# Patient Record
Sex: Female | Born: 1985 | Race: White | Hispanic: No | Marital: Single | Smoking: Former smoker
Health system: Southern US, Community
[De-identification: ages and names within clinical notes are randomized; demographics above are authoritative.]

## PROBLEM LIST (undated history)

## (undated) DIAGNOSIS — E669 Obesity, unspecified: Secondary | ICD-10-CM

## (undated) DIAGNOSIS — N823 Fistula of vagina to large intestine: Secondary | ICD-10-CM

## (undated) DIAGNOSIS — D649 Anemia, unspecified: Secondary | ICD-10-CM

## (undated) DIAGNOSIS — K5010 Crohn's disease of large intestine without complications: Secondary | ICD-10-CM

## (undated) HISTORY — PX: OTHER SURGICAL HISTORY: SHX169

## (undated) HISTORY — PX: FISTULA PLUG: SHX5831

## (undated) HISTORY — DX: Obesity, unspecified: E66.9

## (undated) HISTORY — DX: Fistula of vagina to large intestine: N82.3

## (undated) HISTORY — DX: Anemia, unspecified: D64.9

## (undated) HISTORY — DX: Crohn's disease of large intestine without complications: K50.10

---
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
## 2008-09-23 ENCOUNTER — Emergency Department: Payer: Self-pay | Admitting: Internal Medicine

## 2010-02-03 ENCOUNTER — Ambulatory Visit: Payer: Self-pay | Admitting: Otolaryngology

## 2010-08-04 ENCOUNTER — Ambulatory Visit: Payer: Self-pay | Admitting: General Surgery

## 2010-08-07 ENCOUNTER — Ambulatory Visit: Payer: Self-pay | Admitting: General Surgery

## 2010-09-22 ENCOUNTER — Other Ambulatory Visit: Payer: Self-pay

## 2010-09-25 ENCOUNTER — Inpatient Hospital Stay: Payer: Self-pay | Admitting: Internal Medicine

## 2010-10-01 LAB — PATHOLOGY REPORT

## 2011-01-20 ENCOUNTER — Ambulatory Visit: Payer: Self-pay | Admitting: Unknown Physician Specialty

## 2011-04-24 ENCOUNTER — Ambulatory Visit: Payer: Self-pay | Admitting: Unknown Physician Specialty

## 2011-04-28 LAB — PATHOLOGY REPORT

## 2011-07-29 ENCOUNTER — Emergency Department: Payer: Self-pay | Admitting: *Deleted

## 2012-11-03 DIAGNOSIS — E669 Obesity, unspecified: Secondary | ICD-10-CM | POA: Insufficient documentation

## 2012-11-03 DIAGNOSIS — N823 Fistula of vagina to large intestine: Secondary | ICD-10-CM | POA: Insufficient documentation

## 2012-11-03 HISTORY — DX: Fistula of vagina to large intestine: N82.3

## 2012-11-03 HISTORY — DX: Obesity, unspecified: E66.9

## 2013-02-20 ENCOUNTER — Other Ambulatory Visit: Payer: Self-pay | Admitting: Unknown Physician Specialty

## 2013-02-22 LAB — STOOL CULTURE

## 2014-06-12 DIAGNOSIS — D649 Anemia, unspecified: Secondary | ICD-10-CM

## 2014-06-12 HISTORY — DX: Anemia, unspecified: D64.9

## 2014-10-22 ENCOUNTER — Ambulatory Visit: Payer: Self-pay | Admitting: Unknown Physician Specialty

## 2015-02-22 ENCOUNTER — Emergency Department: Payer: Self-pay | Admitting: Emergency Medicine

## 2015-04-08 LAB — SURGICAL PATHOLOGY

## 2015-08-15 ENCOUNTER — Ambulatory Visit: Payer: Self-pay | Admitting: Urology

## 2015-08-26 ENCOUNTER — Ambulatory Visit (INDEPENDENT_AMBULATORY_CARE_PROVIDER_SITE_OTHER): Payer: BC Managed Care – PPO | Admitting: Urology

## 2015-08-26 ENCOUNTER — Encounter: Payer: Self-pay | Admitting: Urology

## 2015-08-26 VITALS — BP 112/76 | HR 80 | Ht 67.0 in | Wt 293.2 lb

## 2015-08-26 DIAGNOSIS — R312 Other microscopic hematuria: Secondary | ICD-10-CM

## 2015-08-26 DIAGNOSIS — N390 Urinary tract infection, site not specified: Secondary | ICD-10-CM | POA: Diagnosis not present

## 2015-08-26 DIAGNOSIS — R3129 Other microscopic hematuria: Secondary | ICD-10-CM

## 2015-08-26 DIAGNOSIS — K5010 Crohn's disease of large intestine without complications: Secondary | ICD-10-CM

## 2015-08-26 HISTORY — DX: Crohn's disease of large intestine without complications: K50.10

## 2015-08-26 MED ORDER — CEPHALEXIN 500 MG PO CAPS: ORAL_CAPSULE | ORAL | Status: DC

## 2015-08-26 NOTE — Progress Notes (Signed)
Consultation for recurrent urinary tract infection.  1-recurrent UTI-patient has a recurrent episodes of tissue area, frequency, urgency and foul smelling urine. Cultures of been positive in the past but she believes her most recent culture was negative. Antibiotic to relieve her symptoms. She has no neurogenic risk. She usually voids with a good stream unless she is going frequently due to urgency from urinary tract infection. She's had no gross hematuria. She had microscopic hematuria on a prior exam but she was on her period. She's had no pneumaturia. 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 It would be difficult to surgically repair she's been told. She fell fistula drains and plucks. The fistula may be near the introitus. She mass times and she has bacteria in her urine and is asymptomatic but she is on Humira. There is a concern with her immunosuppression it could lead to a symptomatic UTI. She's had no fevers with these infections.  Her past medical, surgical history, family history, social history, medications and allergies were reviewed with significant findings noted above.  14 system review of systems obtained which was negative except for the following: Dizziness  Physical exam: Patient in no acute distress. She is alert and oriented 3.  Urinalysis shows 0-2 red cells, 6-10 white cells, moderate bacteria   Assessment/plan-recurrent UTI-although some of her episodes may be from asymptomatic bacteriuria any bacteria in her urine is a bit of a concern with her use of Humira. Will assess the kidneys and the bladder with an ultrasound to rule out any obvious nidus of infection or hydronephrosis. Also to check a post void residual. She'll return for pelvic exam and cystoscopy and we discussed the nature risk and benefits of these tests. In the meantime I'll send some cephalexin to her pharmacy so that she can have self start antibiotic. Also sent  urine for culture today.  Microscopic hematuria-this was likely contaminant from patients. But given her recurrent UTI well assess upper tracts with ultrasound. Hold off on CT for now.

## 2015-08-27 LAB — URINALYSIS, COMPLETE
Bilirubin, UA: NEGATIVE
GLUCOSE, UA: NEGATIVE
Ketones, UA: NEGATIVE
NITRITE UA: NEGATIVE
Protein, UA: NEGATIVE
Specific Gravity, UA: >1.03 — ABNORMAL HIGH (ref 1.005–1.030)
UUROB: 0.2 mg/dL (ref 0.2–1.0)
pH, UA: 5 (ref 5.0–7.5)

## 2015-08-27 LAB — URINE CULTURE

## 2015-08-27 LAB — MICROSCOPIC EXAMINATION

## 2015-08-28 ENCOUNTER — Other Ambulatory Visit: Payer: Self-pay | Admitting: Urology

## 2015-08-28 MED ORDER — CEPHALEXIN 500 MG PO CAPS: ORAL_CAPSULE | ORAL | Status: DC

## 2015-08-28 MED ORDER — CEPHALEXIN 500 MG PO CAPS: ORAL_CAPSULE | ORAL | Status: AC

## 2015-09-19 ENCOUNTER — Ambulatory Visit (INDEPENDENT_AMBULATORY_CARE_PROVIDER_SITE_OTHER): Payer: BC Managed Care – PPO | Admitting: Urology

## 2015-09-19 VITALS — BP 136/83 | HR 90 | Ht 67.0 in | Wt 294.8 lb

## 2015-09-19 DIAGNOSIS — R3129 Other microscopic hematuria: Secondary | ICD-10-CM

## 2015-09-19 LAB — URINALYSIS, COMPLETE
Bilirubin, UA: NEGATIVE
GLUCOSE, UA: NEGATIVE
NITRITE UA: NEGATIVE
Specific Gravity, UA: >1.03 — ABNORMAL HIGH (ref 1.005–1.030)
Urobilinogen, Ur: 0.2 mg/dL (ref 0.2–1.0)
pH, UA: 5.5 (ref 5.0–7.5)

## 2015-09-19 LAB — MICROSCOPIC EXAMINATION
Renal Epithel, UA: NONE SEEN /hpf
WBC, UA: >30 /hpf — AB (ref 0–?)

## 2015-09-19 MED ORDER — LIDOCAINE HCL 2 % EX GEL: 1.0000 "application " | Freq: Once | CUTANEOUS | Status: DC

## 2015-09-19 MED ORDER — CIPROFLOXACIN HCL 500 MG PO TABS: 500.0000 mg | ORAL_TABLET | Freq: Once | ORAL | Status: DC

## 2015-09-19 NOTE — Progress Notes (Signed)
Patient has suspicious U/A and dysuria.  Will culture her urine and treat if positive.  Will reschedule Cysto. Patient also still needs renal/bladder u/s.

## 2015-09-21 LAB — CULTURE, URINE COMPREHENSIVE

## 2015-09-23 ENCOUNTER — Telehealth: Payer: Self-pay | Admitting: Urology

## 2015-09-23 NOTE — Telephone Encounter (Signed)
The pt called requesting her urine culture results from her visit on Thursday, Oct 09/19/15. Pt notified per Mendel Ryder that her urine culture was negative for bacteria.

## 2015-10-04 ENCOUNTER — Ambulatory Visit
Admission: RE | Admit: 2015-10-04 | Discharge: 2015-10-04 | Disposition: A | Discharge status: Home / Self Care | Payer: BC Managed Care – PPO | Source: Ambulatory Visit | Attending: Urology | Admitting: Urology

## 2015-10-04 DIAGNOSIS — R3129 Other microscopic hematuria: Secondary | ICD-10-CM | POA: Insufficient documentation

## 2015-10-15 ENCOUNTER — Encounter: Payer: Self-pay | Admitting: Urology

## 2015-10-15 ENCOUNTER — Ambulatory Visit (INDEPENDENT_AMBULATORY_CARE_PROVIDER_SITE_OTHER): Payer: BC Managed Care – PPO | Admitting: Urology

## 2015-10-15 VITALS — BP 121/77 | HR 71 | Ht 66.0 in | Wt 296.5 lb

## 2015-10-15 DIAGNOSIS — R3129 Other microscopic hematuria: Secondary | ICD-10-CM

## 2015-10-15 LAB — URINALYSIS, COMPLETE
BILIRUBIN UA: NEGATIVE
Glucose, UA: NEGATIVE
Ketones, UA: NEGATIVE
NITRITE UA: NEGATIVE
Protein, UA: NEGATIVE
Urobilinogen, Ur: 0.2 mg/dL (ref 0.2–1.0)
pH, UA: 5 (ref 5.0–7.5)

## 2015-10-15 LAB — MICROSCOPIC EXAMINATION
RBC, UA: >30 /hpf — AB (ref 0–?)
RENAL EPITHEL UA: NONE SEEN /HPF

## 2015-10-15 MED ORDER — CIPROFLOXACIN HCL 500 MG PO TABS: 500.0000 mg | ORAL_TABLET | Freq: Once | ORAL | Status: AC

## 2015-10-15 MED ORDER — LIDOCAINE HCL 2 % EX GEL: 1.0000 "application " | Freq: Once | CUTANEOUS | Status: AC

## 2015-10-15 NOTE — Progress Notes (Signed)
F/u recurrent UTI with symptoms of dysuria, frequency, urgency and foul smelling urine. Cultures of been positive in the past. Abx relieve symptoms. No neurogenic risk. She usually voids with a good stream. 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 The fistula may be near the introitus. She is on Humira.   She returns today in continued management of bacteriuria. A renal ultrasound was normal. The bladder was not distended. I reviewed all the images. Compared images to CT in 2011 and MRI of the pelvis in 2012.  Repeat urine culture showed a low colony count of mixed growth.  Chaperone: kelita PE: abd soft, NT GU - bladder and urethra are palpably normal. Urethral meatus appeared normal. Introitus appeared normal.   Cystoscopy - the 15 French flexible cystoscope was inserted per urethra after she was prepped in the usual sterile fashion and after she was in and out catheterized the sterile 14 French red rubber catheter. Findings: The urethra appeared normal. The trigone and ureteral orifices were in their normal orthotopic position with clear efflux bilaterally. The bladder mucosa was smooth and without lesion. There were no stones or foreign bodies in the bladder. There were no areas concerning for fistula.   A/P: Bacteriuria - I sent a cathed UA to lab today to ensure she truly has bacteria in urine and that her UA's aren't simply contaminants. Her renal u/s, cystoscopy and exam were normal. She may resume Humira from a GU pt of view.   F/u 3 - 6 months for symptom check.

## 2015-10-16 LAB — MICROSCOPIC EXAMINATION
BACTERIA UA: NONE SEEN
RENAL EPITHEL UA: NONE SEEN /HPF
WBC, UA: NONE SEEN /hpf (ref 0–?)

## 2015-10-16 LAB — URINALYSIS, COMPLETE
Bilirubin, UA: NEGATIVE
GLUCOSE, UA: NEGATIVE
Ketones, UA: NEGATIVE
Leukocytes, UA: NEGATIVE
NITRITE UA: NEGATIVE
PH UA: 5.5 (ref 5.0–7.5)
Protein, UA: NEGATIVE
Specific Gravity, UA: <1.005 — ABNORMAL LOW (ref 1.005–1.030)
Urobilinogen, Ur: 0.2 mg/dL (ref 0.2–1.0)

## 2015-10-16 NOTE — Progress Notes (Signed)
Patient's cath UA was clear. No bacteria, wbc or rbc's.

## 2015-10-17 LAB — CULTURE, URINE COMPREHENSIVE

## 2015-12-19 ENCOUNTER — Ambulatory Visit: Payer: BC Managed Care – PPO | Admitting: Urology

## 2015-12-19 ENCOUNTER — Encounter: Payer: Self-pay | Admitting: Urology

## 2017-02-01 ENCOUNTER — Ambulatory Visit: Payer: BC Managed Care – PPO | Admitting: Urology

## 2017-02-01 VITALS — BP 128/82 | HR 94 | Ht 67.0 in | Wt 297.4 lb

## 2017-02-01 DIAGNOSIS — R102 Pelvic and perineal pain: Secondary | ICD-10-CM | POA: Diagnosis not present

## 2017-02-01 DIAGNOSIS — R3129 Other microscopic hematuria: Secondary | ICD-10-CM | POA: Diagnosis not present

## 2017-02-01 LAB — URINALYSIS, COMPLETE
BILIRUBIN UA: NEGATIVE
GLUCOSE, UA: NEGATIVE
KETONES UA: NEGATIVE
NITRITE UA: NEGATIVE
SPEC GRAV UA: 1.02 (ref 1.005–1.030)
UUROB: 0.2 mg/dL (ref 0.2–1.0)
pH, UA: 5.5 (ref 5.0–7.5)

## 2017-02-01 LAB — MICROSCOPIC EXAMINATION

## 2017-02-01 MED ORDER — DIAZEPAM 10 MG PO TABS: 10.0000 mg | ORAL_TABLET | Freq: Every evening | ORAL | 0 refills | Status: DC | PRN

## 2017-02-01 NOTE — Progress Notes (Signed)
02/01/2017 11:21 AM   Diane Burgess 1986/11/18 [id number]  Referring provider: Sherrin Daisy, MD [place], [place] [address]  Chief Complaint  Patient presents with  . Follow-up    recurrent UTI    HPI: 31 year old obese female with a history of Crohn's disease and a rectovaginal fistula who presents today for dysuria. Her symptoms of present present for the past 2 and a half weeks. She describes a constant bladder pain, worse with a full bladder area and she does have pain with urination. She denies any hematuria. She denies any incontinence. She denies any worsening frequency or urgency. She does state that she at times strains to void, has intermittency, and the feeling of incomplete bladder emptying. She does take Humira, but stops because of the pain recently. She was fully evaluated in the fall of 2016 including cystoscopy which was normal. The patient is not sexually active, and does not endorse vaginal tenderness. She denies constipation, conversely she has loose bowels typically because of her Crohn's.  The patient's urinalysis today is contaminated with greater than 10 epithelial cells. There is also microscopic hematuria and white blood cells.     PMH: Past Medical History:  Diagnosis Date  . 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 Adiposity 11/03/2012  . Crohn's disease of rectum ([place]) 08/26/2015  . 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 fistula drain    . FISTULA PLUG      Home Medications:  Allergies as of 02/01/2017   No Known Allergies     Medication List       Accurate as of 02/01/17 11:21 AM. Always use your most recent med list.          Adalimumab 40 MG/0.8ML Pnkt Inject into the skin.   cephALEXin 500 MG capsule Commonly known as:  KEFLEX Take by mouth.       Allergies: No Known Allergies  Family History: Family History  Problem Relation Age of Onset  .  Prostate cancer Neg Hx   . Kidney cancer Neg Hx   . Bladder Cancer Neg Hx     Social History:  reports that she quit smoking about 7 years ago. She does not have any smokeless tobacco history on file. She reports that she does not drink alcohol or use drugs.  ROS: UROLOGY Frequent Urination?: Yes Hard to postpone urination?: No Burning/pain with urination?: Yes Get up at night to urinate?: No Leakage of urine?: No Urine stream starts and stops?: No Trouble starting stream?: Yes Do you have to strain to urinate?: No Blood in urine?: No Urinary tract infection?: Yes Sexually transmitted disease?: No Injury to kidneys or bladder?: No Painful intercourse?: No Weak stream?: No Currently pregnant?: No Vaginal bleeding?: No Last menstrual period?: n  Gastrointestinal Nausea?: Yes Vomiting?: No Indigestion/heartburn?: No Diarrhea?: No Constipation?: No  Constitutional Fever: No Night sweats?: No Weight loss?: No Fatigue?: No  Skin Skin rash/lesions?: No Itching?: No  Eyes Blurred vision?: No Double vision?: No  Ears/Nose/Throat Sore throat?: No Sinus problems?: No  Hematologic/Lymphatic Swollen glands?: No Easy bruising?: No  Cardiovascular Leg swelling?: No Chest pain?: No  Respiratory Cough?: No Shortness of breath?: No  Endocrine Excessive thirst?: No  Musculoskeletal Back pain?: No Joint pain?: No  Neurological Headaches?: No Dizziness?: No  Psychologic Depression?: No Anxiety?: No  Physical Exam: BP 128/82   Pulse 94   Ht 5' 7"  (1.702 m)   Wt  134.9 kg (297 lb 6.4 oz)   BMI 46.58 kg/m   Constitutional:  Alert and oriented, No acute distress. HEENT: [place] AT, moist mucus membranes.  Trachea midline, no masses. Cardiovascular: No clubbing, cyanosis, or edema. Respiratory: Normal respiratory effort, no increased work of breathing. GI: Abdomen is soft, nontender, nondistended, no abdominal masses GU: No CVA tenderness. Skin: No rashes,  bruises or suspicious lesions. Lymph: No cervical or inguinal adenopathy. Neurologic: Grossly intact, no focal deficits, moving all 4 extremities. Psychiatric: Normal mood and affect.  Laboratory Data: No results found for: WBC, HGB, HCT, MCV, PLT  No results found for: CREATININE  No results found for: PSA  No results found for: TESTOSTERONE  No results found for: HGBA1C  Urinalysis    Component Value Date/Time   APPEARANCEUR Clear 10/15/2015 1645   GLUCOSEU Negative 10/15/2015 1645   BILIRUBINUR Negative 10/15/2015 1645   PROTEINUR Negative 10/15/2015 1645   NITRITE Negative 10/15/2015 1645   LEUKOCYTESUR Negative 10/15/2015 1645    Pertinent Imaging: No images reviewed  Assessment & Plan:  The patient has what sounds to me like pelvic floor dysfunction. Her urine today is contaminated and we will repeat it with a cath specimen. We will send this for culture to ensure she does not have an infection. Beyond that, I recommended that we try intravaginal volume in [place] for his symptoms. If her symptoms have not improved, she will return in 1 month and we will likely refer her to physical therapy and do a more complete evaluation including cystoscopy and a more thorough/detailed pelvic evaluation. The patient will also inquire with her Crohn's doctor whether she can take meloxicam, which would otherwise be helpful for her pelvic inflammation and pain. 1. Microhematuria Straight cath, f/u urine culture. - Urinalysis, Complete   No Follow-up on file.  Ardis Hughs, [facility] [address], [place], [place] [address] [phone]

## 2017-02-04 LAB — CULTURE, URINE COMPREHENSIVE

## 2017-02-10 ENCOUNTER — Telehealth: Payer: Self-pay

## 2017-02-10 DIAGNOSIS — R102 Pelvic and perineal pain: Secondary | ICD-10-CM

## 2017-02-10 MED ORDER — MELOXICAM 15 MG PO TABS: 15.0000 mg | ORAL_TABLET | Freq: Every day | ORAL | 3 refills | Status: DC

## 2017-02-10 NOTE — Telephone Encounter (Signed)
Pt called stating she spoke with her Crohn's physician who stated it was ok for her to take meloxicam. Therefore per Dr. Louis Meckel dictation medication was sent to pt pharmacy.

## 2017-06-13 DIAGNOSIS — E538 Deficiency of other specified B group vitamins: Secondary | ICD-10-CM | POA: Insufficient documentation

## 2017-07-06 ENCOUNTER — Encounter: Payer: Self-pay | Admitting: *Deleted

## 2017-07-07 ENCOUNTER — Ambulatory Visit: Payer: BC Managed Care – PPO | Admitting: Anesthesiology

## 2017-07-07 ENCOUNTER — Ambulatory Visit
Admission: RE | Admit: 2017-07-07 | Discharge: 2017-07-07 | Disposition: A | Discharge status: Home / Self Care | Payer: BC Managed Care – PPO | Source: Ambulatory Visit | Attending: Unknown Physician Specialty | Admitting: Unknown Physician Specialty

## 2017-07-07 ENCOUNTER — Encounter: Payer: Self-pay | Admitting: *Deleted

## 2017-07-07 ENCOUNTER — Encounter
Admission: RE | Disposition: A | Discharge status: Home / Self Care | Payer: Self-pay | Source: Ambulatory Visit | Attending: Unknown Physician Specialty

## 2017-07-07 DIAGNOSIS — Z87891 Personal history of nicotine dependence: Secondary | ICD-10-CM | POA: Insufficient documentation

## 2017-07-07 DIAGNOSIS — D120 Benign neoplasm of cecum: Secondary | ICD-10-CM | POA: Insufficient documentation

## 2017-07-07 DIAGNOSIS — Z79899 Other long term (current) drug therapy: Secondary | ICD-10-CM | POA: Insufficient documentation

## 2017-07-07 DIAGNOSIS — K5090 Crohn's disease, unspecified, without complications: Secondary | ICD-10-CM | POA: Diagnosis present

## 2017-07-07 DIAGNOSIS — K6389 Other specified diseases of intestine: Secondary | ICD-10-CM | POA: Insufficient documentation

## 2017-07-07 DIAGNOSIS — K640 First degree hemorrhoids: Secondary | ICD-10-CM | POA: Insufficient documentation

## 2017-07-07 DIAGNOSIS — D649 Anemia, unspecified: Secondary | ICD-10-CM | POA: Insufficient documentation

## 2017-07-07 HISTORY — PX: COLONOSCOPY WITH PROPOFOL: SHX5780

## 2017-07-07 LAB — POCT PREGNANCY, URINE: PREG TEST UR: NEGATIVE

## 2017-07-07 SURGERY — COLONOSCOPY WITH PROPOFOL
Anesthesia: General

## 2017-07-07 MED ORDER — SODIUM CHLORIDE 0.9 % IV SOLN
INTRAVENOUS | Status: DC
  Administered 2017-07-07: 1000 mL via INTRAVENOUS

## 2017-07-07 MED ORDER — MIDAZOLAM HCL 2 MG/2ML IJ SOLN
INTRAMUSCULAR | Status: DC | PRN
Start: 2017-07-07 — End: 2017-07-07
  Administered 2017-07-07: 2 mg via INTRAVENOUS

## 2017-07-07 MED ORDER — SODIUM CHLORIDE 0.9 % IV SOLN
INTRAVENOUS | Status: DC
  Administered 2017-07-07: 15:00:00 via INTRAVENOUS

## 2017-07-07 MED ORDER — MIDAZOLAM HCL 2 MG/2ML IJ SOLN
INTRAMUSCULAR | Status: AC
  Filled 2017-07-07: qty 2

## 2017-07-07 MED ORDER — PROPOFOL 10 MG/ML IV BOLUS
INTRAVENOUS | Status: DC | PRN
  Administered 2017-07-07: 40 mg via INTRAVENOUS

## 2017-07-07 MED ORDER — PROPOFOL 500 MG/50ML IV EMUL
INTRAVENOUS | Status: DC | PRN
  Administered 2017-07-07: 140 ug/kg/min via INTRAVENOUS

## 2017-07-07 MED ORDER — PROPOFOL 500 MG/50ML IV EMUL
INTRAVENOUS | Status: AC
  Filled 2017-07-07: qty 50

## 2017-07-07 NOTE — Anesthesia Post-op Follow-up Note (Cosign Needed)
Anesthesia QCDR form completed.        

## 2017-07-07 NOTE — Anesthesia Postprocedure Evaluation (Signed)
Anesthesia Post Note  Patient: Diane Burgess  Procedure(s) Performed: Procedure(s) (LRB): COLONOSCOPY WITH PROPOFOL (N/A)  Patient location during evaluation: Endoscopy Anesthesia Type: General Level of consciousness: awake and alert and oriented Pain management: pain level controlled Vital Signs Assessment: post-procedure vital signs reviewed and stable Respiratory status: spontaneous breathing, nonlabored ventilation and respiratory function stable Cardiovascular status: blood pressure returned to baseline and stable Postop Assessment: no signs of nausea or vomiting Anesthetic complications: no     Last Vitals:  Vitals:   07/07/17 1608 07/07/17 1618  BP: 120/72 133/82  Pulse: 88 75  Resp: 16 16  Temp:      Last Pain:  Vitals:   07/07/17 1548  TempSrc: Tympanic                 [name]

## 2017-07-07 NOTE — Op Note (Signed)
[facility] Patient Name: Diane Burgess Procedure Date: 07/07/2017 3:15 PM MRN: [id number] Account #: [id number] Date of Birth: [birth year] Admit Type: Outpatient Age: 31 Room: [facility] ENDO ROOM 3 Gender: Female Note Status: Finalized Procedure:            Colonoscopy Indications:          High risk colon cancer surveillance: Crohn's disease                        large intestine Providers:            Manya Silvas, MD Referring MD:         Toniann Ket, MD (Referring MD) Medicines:            Propofol per Anesthesia Complications:        No immediate complications. Procedure:            Pre-Anesthesia Assessment:                       - After reviewing the risks and benefits, the patient                        was deemed in satisfactory condition to undergo the                        procedure.                       After obtaining informed consent, the colonoscope was                        passed under direct vision. Throughout the procedure,                        the patient's blood pressure, pulse, and oxygen                        saturations were monitored continuously. The                        Colonoscope was introduced through the anus and                        advanced to the the cecum, identified by appendiceal                        orifice and ileocecal valve. The colonoscopy was                        performed without difficulty. The patient tolerated the                        procedure well. The quality of the bowel preparation                        was good. Findings:      Two sessile polyps were found in the ileocecal valve. The polyps were       small in size. These polyps were removed with a hot snare. Resection and       retrieval were complete.      A diffuse area of mildly  erythematous mucosa was found in the sigmoid       colon and in the descending colon. Somewhat patchy, somewhat slightly       swollen.   rough Internal hemorrhoids were found during endoscopy. The hemorrhoids       were small and Grade I (internal hemorrhoids that do not prolapse).      Numerous pseudopolyps were seen Throughout the colon. In most of the       colon there was no obvious colon inflammation. Impression:           - Two small polyps at the ileocecal valve, removed with                        a hot snare. Resected and retrieved.                       - Erythematous mucosa in the sigmoid colon and in the                        descending colon.                       - Internal hemorrhoids. Recommendation:       - Await pathology results. Manya Silvas, MD 07/07/2017 3:49:57 PM This report has been signed electronically. Number of Addenda: 0 Note Initiated On: 07/07/2017 3:15 PM Scope Withdrawal Time: 0 hours 15 minutes 45 seconds  Total Procedure Duration: 0 hours 21 minutes 37 seconds       [facility]

## 2017-07-07 NOTE — H&P (Signed)
   Primary Care Physician:  Katheren Shams Primary Gastroenterologist:  Dr. Vira Agar  Pre-Procedure History & Physical: HPI:  Diane Burgess is a 31 y.o. female is here for an colonoscopy.   Past Medical History:  Diagnosis Date  . 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 Adiposity 11/03/2012  . Crohn's disease of rectum ([place]) 08/26/2015  . 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 fistula drain    . FISTULA PLUG      Prior to Admission medications   Medication Sig Start Date End Date Taking? Authorizing Provider  Adalimumab 40 MG/0.8ML PNKT Inject into the skin. 10/22/10   [provider]  diazepam (VALIUM) 10 MG tablet Take 1 tablet (10 mg total) by mouth at bedtime as needed (vaginal/pelvic pain). Insert deep into vagina prior to bedtime Patient not taking: Reported on 07/07/2017 02/01/17   Ardis Hughs, MD  meloxicam (MOBIC) 15 MG tablet Take 1 tablet (15 mg total) by mouth daily. Patient not taking: Reported on 07/07/2017 02/10/17   Ardis Hughs, MD    Allergies as of 05/13/2017  . (No Known Allergies)    Family History  Problem Relation Age of Onset  . Prostate cancer Neg Hx   . Kidney cancer Neg Hx   . Bladder Cancer Neg Hx     Social History   Social History  . Marital status: Single    Spouse name: N/A  . Number of children: N/A  . Years of education: N/A   Occupational History  . Not on file.   Social History Main Topics  . Smoking status: Former Smoker    Quit date: 08/25/2009  . Smokeless tobacco: Never Used  . Alcohol use No  . Drug use: No  . Sexual activity: Not on file   Other Topics Concern  . Not on file   Social History Narrative  . No narrative on file    Review of Systems: See HPI, otherwise negative ROS  Physical Exam: BP (!) 154/104   Pulse 98   Temp 98 F (36.7 C) (Tympanic)   Resp 18   Ht 5' 7"  (1.702 m)   Wt 136.1 kg (300 lb)   SpO2 100%   BMI 46.99  kg/m  General:   Alert,  pleasant and cooperative in NAD Head:  Normocephalic and atraumatic. Neck:  Supple; no masses or thyromegaly. Lungs:  Clear throughout to auscultation.    Heart:  Regular rate and rhythm. Abdomen:  Soft, nontender and nondistended. Normal bowel sounds, without guarding, and without rebound.   Neurologic:  Alert and  oriented x4;  grossly normal neurologically.  Impression/Plan: Diane Burgess is here for an colonoscopy to be performed for Crohn's disease.  Risks, benefits, limitations, and alternatives regarding  colonoscopy have been reviewed with the patient.  Questions have been answered.  All parties agreeable.   Gaylyn Cheers, MD  07/07/2017, 3:12 PM

## 2017-07-07 NOTE — Anesthesia Preprocedure Evaluation (Signed)
Anesthesia Evaluation  Patient identified by MRN, date of birth, ID band Patient awake    Reviewed: Allergy & Precautions, H&P , NPO status , Patient's Chart, lab work & pertinent test results  History of Anesthesia Complications Negative for: history of anesthetic complications  Airway Mallampati: III  TM Distance: >3 FB Neck ROM: full    Dental  (+) Teeth Intact   Pulmonary neg shortness of breath, former smoker,           Cardiovascular Exercise Tolerance: Good (-) angina(-) Past MI and (-) DOE negative cardio ROS       Neuro/Psych negative neurological ROS  negative psych ROS   GI/Hepatic negative GI ROS, Neg liver ROS, neg GERD  ,  Endo/Other  negative endocrine ROS  Renal/GU negative Renal ROS  negative genitourinary   Musculoskeletal   Abdominal   Peds  Hematology negative hematology ROS (+)   Anesthesia Other Findings Past Medical History: 06/12/2014: Absolute anemia 11/03/2012: Adiposity 08/26/2015: Crohn's disease of rectum ([place]) 11/03/2012: Rectal vaginal fistula  Past Surgical History: No date: fistula drain No date: FISTULA PLUG  BMI    Body Mass Index:  46.99 kg/m      Reproductive/Obstetrics negative OB ROS                             Anesthesia Physical Anesthesia Plan  ASA: III  Anesthesia Plan: General   Post-op Pain Management:    Induction: Intravenous  PONV Risk Score and Plan:   Airway Management Planned: Natural Airway and Nasal Cannula  Additional Equipment:   Intra-op Plan:   Post-operative Plan:   Informed Consent: I have reviewed the patients History and Physical, chart, labs and discussed the procedure including the risks, benefits and alternatives for the proposed anesthesia with the patient or authorized representative who has indicated his/her understanding and acceptance.   Dental Advisory Given  Plan Discussed with:  Anesthesiologist, CRNA and Surgeon  Anesthesia Plan Comments: (Patient consented for risks of anesthesia including but not limited to:  - adverse reactions to medications - risk of intubation if required - damage to teeth, lips or other oral mucosa - sore throat or hoarseness - Damage to heart, brain, lungs or loss of life  Patient voiced understanding.)        Anesthesia Quick Evaluation

## 2017-07-07 NOTE — Anesthesia Post-op Follow-up Note (Deleted)
Anesthesia QCDR form completed.        

## 2017-07-07 NOTE — Transfer of Care (Signed)
Immediate Anesthesia Transfer of Care Note  Patient: Diane Burgess  Procedure(s) Performed: Procedure(s): COLONOSCOPY WITH PROPOFOL (N/A)  Patient Location: Endoscopy Unit  Anesthesia Type:General  Level of Consciousness: awake, oriented and patient cooperative  Airway & Oxygen Therapy: Patient Spontanous Breathing and Patient connected to nasal cannula oxygen  Post-op Assessment: Report given to RN, Post -op Vital signs reviewed and stable and Patient moving all extremities X 4  Post vital signs: Reviewed and stable  Last Vitals:  Vitals:   07/07/17 1422  BP: (!) 154/104  Pulse: 98  Resp: 18  Temp: 36.7 C    Last Pain:  Vitals:   07/07/17 1422  TempSrc: Tympanic         Complications: No apparent anesthesia complications

## 2017-07-08 ENCOUNTER — Encounter: Payer: Self-pay | Admitting: Unknown Physician Specialty

## 2017-07-09 LAB — SURGICAL PATHOLOGY

## 2018-10-20 DIAGNOSIS — K602 Anal fissure, unspecified: Secondary | ICD-10-CM | POA: Insufficient documentation

## 2018-10-21 ENCOUNTER — Ambulatory Visit: Payer: BC Managed Care – PPO | Admitting: Obstetrics and Gynecology

## 2018-10-21 ENCOUNTER — Encounter: Payer: Self-pay | Admitting: Obstetrics and Gynecology

## 2018-10-21 VITALS — BP 126/89 | HR 94 | Ht 67.0 in | Wt 300.0 lb

## 2018-10-21 DIAGNOSIS — K50113 Crohn's disease of large intestine with fistula: Secondary | ICD-10-CM | POA: Diagnosis not present

## 2018-10-21 DIAGNOSIS — K602 Anal fissure, unspecified: Secondary | ICD-10-CM

## 2018-10-21 DIAGNOSIS — K603 Anal fistula: Secondary | ICD-10-CM | POA: Diagnosis not present

## 2018-10-21 NOTE — Progress Notes (Signed)
HPI:      Ms. Diane Burgess is a 32 y.o. No obstetric history on file. who LMP was Patient's last menstrual period was 10/10/2018 (exact date).  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  Patient has a history of Crohn's disease and recurrent rectovaginal fistulae.  She has had previous surgery including mesh placement but this "failed".  She has been dealing with a fistula that she knows about for 2 to 3 years but it recently has become more tender and actually felt like it was "tearing".  She also complains of some anal verge discomfort and wonders if there is any evidence of fistula in this area.  She reports that she does not have any stool coming through the fistula into the vagina.  She has an appointment at [facility] in the near future for evaluation and management of her Crohn's disease and fistulae sequelae.   She is currently not sexually active and is not using any birth control.  She has normal regular menstrual cycles.    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 She does not have any pertinent problems on file. She  has a past surgical history that includes Fistula plug; fistula drain; and Colonoscopy with propofol (N/A, 07/07/2017). Her family history is not on file. She  reports that she quit smoking about 9 years ago. She has never used smokeless tobacco. She reports that she does not drink alcohol or use drugs. She has a current medication list which includes the following Facility-Administered Medications: ciprofloxacin and lidocaine. She has No Known Allergies.       Review of Systems:  Review of Systems  Constitutional: Denied constitutional symptoms, night sweats, recent illness, fatigue, fever, insomnia and  weight loss.  Eyes: Denied eye symptoms, eye pain, photophobia, vision change and visual disturbance.  Ears/Nose/Throat/Neck: Denied ear, nose, throat or neck symptoms, hearing loss, nasal discharge, sinus congestion and sore throat.  Cardiovascular: Denied cardiovascular symptoms, arrhythmia, chest pain/pressure, edema, exercise intolerance, orthopnea and palpitations.  Respiratory: Denied pulmonary symptoms, asthma, pleuritic pain, productive sputum, cough, dyspnea and wheezing.  Gastrointestinal: Denied, gastro-esophageal reflux, melena, nausea and vomiting.  Genitourinary: See HPI for additional information.  Musculoskeletal: Denied musculoskeletal symptoms, stiffness, swelling, muscle weakness and myalgia.  Dermatologic: Denied dermatology symptoms, rash and scar.  Neurologic: Denied neurology symptoms, dizziness, headache, neck pain and syncope.  Psychiatric: Denied psychiatric symptoms, anxiety and depression.  Endocrine: Denied endocrine symptoms including hot flashes and night sweats.   Meds:   No current outpatient medications on file prior to visit.   Current Facility-Administered Medications on File Prior to Visit  Medication Dose Route Frequency Provider Last Rate Last Dose  . ciprofloxacin (CIPRO) tablet 500 mg  500 mg Oral Once Festus Aloe, MD      . lidocaine (XYLOCAINE) 2 % jelly 1 application  1 application Urethral Once Festus Aloe, MD        Objective:     Vitals:   10/21/18 0836  BP: 126/89  Pulse: 94              Physical examination   Pelvic:   Vulva:  There is an obvious fistula present in the perineal body just below the introitus.  It is draining a yellowish discharge.  Vagina: No lesions or abnormalities noted.  The same discharge as noted above can be elicited just inside the hymeneal ring although no obvious fistula or fistulous tract is noted in this area.  Support: Normal pelvic support.  Urethra No masses tenderness or scarring.   Meatus Normal size without lesions or prolapse.     Anus:  There is an anal fissure present but I could find no tract to indicate it is related to her fistulae formation  Perineum: Normal exam.  No lesions.     Assessment:    No obstetric history on file. Patient Active Problem List   Diagnosis Date Noted  . Crohn's disease of rectum ([place]) 08/26/2015  . 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 Adiposity 11/03/2012  . 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 Anal fistula   2. Anal fissure   3. Crohn's disease of large intestine with fistula ([place])     Crohn's disease resulting fistulae.  Patient is obviously aware of these issues and has an appointment at [facility] with a Crohn's disease specialist who may then refer her for further evaluation and possible treatment.   Plan:            1.  Patient to keep her [facility] appointment.  We have discussed anal fissures in detail with special attention to diagnosis and management. Orders No orders of the defined types were placed in this encounter.   No orders of the defined types were placed in this encounter.     F/U  No follow-ups on file. I spent 32 minutes involved in the care of this patient of which greater than 50% was spent discussing Crohn's disease, rectovaginal fistulae, work-up management treatment etc.  All her questions were answered to the best of my ability.  Finis Bud, M.D. 10/21/2018 9:52 AM

## 2018-10-21 NOTE — Progress Notes (Signed)
NP presents today to discuss a fistula she has had for 3-4 years. Pt states she has tried treating the fistula before but has not has success.

## 2018-11-21 NOTE — Progress Notes (Signed)
11/22/2018  3:28 PM   Diane Burgess [birth year] [id number]  Referring provider: Katheren Shams [address] [place], [place] [address]  Chief Complaint  Patient presents with  . Hematuria    HPI: Diane Burgess is a 32 y.o. Female that presents today for her UTI symptoms. She has a history of Crohn's disease.  She admits she hasn't seen a urologist since 01/2017. She reports her perineal fistula is active and they are working out a treatment plan for that.  She complains of frequency, urgency with minimal void, cramping dysuria, urine leakage, and hematuria that began last week. She reports symptoms started with frequency and developed into dysuria by 11/16/2018. She reports that her symptoms are greater in the afternoons and evenings than in the mornings. She notes blood when she wipes (from pink to red) but she is unsure if it is from her fistula or urine. She denies fever, chills, vomiting and diarrhea.  She admits to drinking Cran-Grape juice and attempting to increase her water intake. 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 Malachi Bonds and [facility] (3-4x weekly). She drinks sweet tea with fast food exclusively. She denies drinking alcohol, energy drinks and coffee.  UA today (11/22/2018) revealed moderate bacteria. Sent for culture.  PMH: Past Medical History:  Diagnosis Date  . 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 Adiposity 11/03/2012  . Crohn's disease of rectum ([place]) 08/26/2015  . 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 COLONOSCOPY WITH PROPOFOL N/A 07/07/2017   Procedure: COLONOSCOPY WITH PROPOFOL;  Surgeon: Manya Silvas, MD;  Location: [facility] ENDOSCOPY;  Service: Endoscopy;  Laterality: N/A;  . fistula drain    . FISTULA PLUG     Home Medications:  Allergies as of 11/22/2018   No Known Allergies     Medication List       Accurate as of 11/22/18  3:28 PM. Always use your most recent med list.          sulfamethoxazole-trimethoprim 800-160 MG tablet Commonly known as:  BACTRIM DS,SEPTRA DS Take 1 tablet by mouth every 12 (twelve) hours.      Allergies: No Known Allergies  Family History: Family History  Problem Relation Age of Onset  . Prostate cancer Neg Hx   . Kidney cancer Neg Hx   . Bladder Cancer Neg Hx    Social History:  reports that she quit smoking about 9 years ago. She has never used smokeless tobacco. She reports that she does not drink alcohol or use drugs.  ROS: UROLOGY Frequent Urination?: Yes Hard to postpone urination?: No Burning/pain with urination?: Yes Get up at night to urinate?: No Leakage of urine?: Yes Urine stream starts and stops?: No Trouble starting stream?: No Do you have to strain to urinate?: No Blood in urine?: Yes Urinary tract infection?: Yes Sexually transmitted disease?: No Injury to kidneys or bladder?: No Painful intercourse?: No Weak stream?: No Currently pregnant?: No Vaginal bleeding?: No Last menstrual period?: n  Gastrointestinal Nausea?: No Vomiting?: No Indigestion/heartburn?: No Diarrhea?: No Constipation?: No  Constitutional Fever: No Night sweats?: No Weight loss?: No Fatigue?: Yes  Skin Skin rash/lesions?: No Itching?: No  Eyes Blurred vision?: No Double vision?: No  Ears/Nose/Throat Sore throat?: No Sinus problems?: No  Hematologic/Lymphatic Swollen glands?: No Easy bruising?: No  Cardiovascular Leg swelling?: No Chest pain?: No  Respiratory Cough?: No Shortness of  breath?: No  Endocrine Excessive thirst?: No  Musculoskeletal Back pain?: No Joint pain?: No  Neurological Headaches?: No Dizziness?: No  Psychologic Depression?: No Anxiety?: No  Physical Exam: BP 129/84 (BP Location: Left Arm, Patient Position: Sitting, Cuff Size: Large)   Pulse 86   Ht 5' 7"  (1.702 m)   Wt (!) 304 lb  (137.9 kg)   BMI 47.61 kg/m   Constitutional:  Well nourished. Alert and oriented, No acute distress. Respiratory: Normal respiratory effort, no increased work of breathing. GU: No CVA tenderness.  No bladder fullness or masses.  Normal external genitalia, Regular pubic hair distribution, no lesions.  Normal urethral meatus, no lesions, no prolapse, no discharge.   No urethral masses, tenderness and/or tenderness. Normal vagina mucosa, Normal estrogen effect, no discharge, no lesions, Normal pelvic support, No cystocele and no rectocele noted.  No cervical motion tenderness.  Uterus is freely mobile and non-fixed.  No adnexal/parametria masses or tenderness noted.  Anus and perineum are without rashes or lesions. Colovaginal fistula.  Skin: No rashes, bruises or suspicious lesions. Neurologic: Grossly intact, no focal deficits, moving all 4 extremities. Psychiatric: Normal mood and affect.   Assessment & Plan:    1. 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 Followed by Dr. Jerelene Redden.  - UA today (11/22/2018) revealed moderate bacteria. Sent for culture.  - Prescribed Bactrim for symptoms.   - RTC in 1 month for recheck  2. History of hematuria  - Hematuria workup in 2016 - Dr. Junious Silk examined bladder via cysto with minimal findings. RUS was unremarkable.  - Patient has seen pink on her tissue, but no RBCs on Cath UA  Return in about 1 month (around 12/23/2018) for recheck.  Laneta Simmers   [facility] [address], [place], [place] [address] [phone]   I, Temidayo Atanda-[place]ley[place] , am acting as a Education administrator for Nori Riis, PA-C  I have reviewed the above documentation for accuracy and completeness, and I agree with the above.    Zara Council, PA-C

## 2018-11-22 ENCOUNTER — Encounter: Payer: Self-pay | Admitting: Urology

## 2018-11-22 ENCOUNTER — Ambulatory Visit: Payer: BC Managed Care – PPO | Admitting: Urology

## 2018-11-22 VITALS — BP 129/84 | HR 86 | Ht 67.0 in | Wt 304.0 lb

## 2018-11-22 DIAGNOSIS — R3129 Other microscopic hematuria: Secondary | ICD-10-CM

## 2018-11-22 DIAGNOSIS — R300 Dysuria: Secondary | ICD-10-CM

## 2018-11-22 LAB — URINALYSIS, COMPLETE
BILIRUBIN UA: NEGATIVE
GLUCOSE, UA: NEGATIVE
Ketones, UA: NEGATIVE
Leukocytes, UA: NEGATIVE
NITRITE UA: NEGATIVE
PH UA: 6.5 (ref 5.0–7.5)
PROTEIN UA: NEGATIVE
RBC UA: NEGATIVE
Specific Gravity, UA: 1.02 (ref 1.005–1.030)
Urobilinogen, Ur: 0.2 mg/dL (ref 0.2–1.0)

## 2018-11-22 LAB — MICROSCOPIC EXAMINATION
RBC MICROSCOPIC, UA: NONE SEEN /HPF (ref 0–2)
WBC, UA: NONE SEEN /hpf (ref 0–5)

## 2018-11-22 MED ORDER — SULFAMETHOXAZOLE-TRIMETHOPRIM 800-160 MG PO TABS: 1.0000 | ORAL_TABLET | Freq: Two times a day (BID) | ORAL | 0 refills | Status: DC

## 2018-11-22 NOTE — Progress Notes (Signed)
In and Out Catheterization  Patient is present today for a I & O catheterization due to recurrent UTI . Patient was cleaned and prepped in a sterile fashion with betadine and Lidocaine 2% jelly was instilled into the urethra.  A 14FR cath was inserted no complications were noted , 220m of urine return was noted, urine was yellow in color. A clean urine sample was collected for UA. Bladder was drained  And catheter was removed with out difficulty.    Preformed by: CElberta Leatherwood CMA

## 2018-11-25 ENCOUNTER — Telehealth: Payer: Self-pay

## 2018-11-25 ENCOUNTER — Other Ambulatory Visit: Payer: Self-pay | Admitting: Urology

## 2018-11-25 DIAGNOSIS — R310 Gross hematuria: Secondary | ICD-10-CM

## 2018-11-25 LAB — CULTURE, URINE COMPREHENSIVE

## 2018-11-25 NOTE — Telephone Encounter (Signed)
-----   Message from Nori Riis, PA-C sent at 11/25/2018  7:51 AM EST ----- Please let Santiaga know that her urine culture was negative for infection.  I would like her to have a RUS at this time.

## 2018-11-25 NOTE — Progress Notes (Signed)
RUS orders are in the chart.

## 2018-11-25 NOTE — Telephone Encounter (Signed)
Called pt no answer. LM for pt informing her of the information below,

## 2018-11-25 NOTE — Telephone Encounter (Signed)
-----   Message from Nori Riis, PA-C sent at 11/25/2018  7:51 AM EST ----- Please let Diane Burgess know that her urine culture was negative for infection.  I would like her to have a RUS at this time.

## 2018-11-30 ENCOUNTER — Telehealth: Payer: Self-pay | Admitting: Urology

## 2018-11-30 NOTE — Telephone Encounter (Signed)
Pt left message stating she was given meds last week for UTI and is still having symptoms.  Please call pt [phone]

## 2018-11-30 NOTE — Telephone Encounter (Signed)
Patient was notified that UA culture was negative and she is to have a renal u/s for further evaluation of pain. Patient states she has not received a call from radiology to have this scheduled.  Can you check on this status for me please

## 2018-12-01 NOTE — Telephone Encounter (Signed)
They tried to call her on the 16th but had to lm she has not returned the call to schedule as of today.   Sharyn Lull

## 2018-12-16 DIAGNOSIS — M25561 Pain in right knee: Secondary | ICD-10-CM | POA: Insufficient documentation

## 2018-12-16 DIAGNOSIS — M1711 Unilateral primary osteoarthritis, right knee: Secondary | ICD-10-CM | POA: Insufficient documentation

## 2018-12-22 ENCOUNTER — Ambulatory Visit
Admission: RE | Admit: 2018-12-22 | Discharge: 2018-12-22 | Disposition: A | Discharge status: Home / Self Care | Payer: BC Managed Care – PPO | Source: Ambulatory Visit | Attending: Urology | Admitting: Urology

## 2018-12-22 DIAGNOSIS — R310 Gross hematuria: Secondary | ICD-10-CM | POA: Diagnosis not present

## 2018-12-24 NOTE — Progress Notes (Signed)
12/27/2018  4:34 PM   Diane Burgess August 19, 1986 [id number]  Referring provider: Katheren Shams [address] [place], [place] [address]  Chief Complaint  Patient presents with  . Urinary Tract Infection    HPI: Diane Burgess is a 33 y.o. female Caucasian with a history of Crohn's disease that presents today for a recheck on her UTI symptoms.  Background History On her 11/22/2018 visit, she admitted she hasn't seen a urologist since 01/2017. 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  She complained of frequency, urgency with minimal void, cramping dysuria, urine leakage, and hematuria that began last week. She reported symptoms started with frequency and developed into dysuria by 11/16/2018. She reported that her symptoms are greater in the afternoons and evenings than in the mornings. She noted blood when she wipes (from pink to red) but she was unsure if it is from her fistula or urine. She denied fever, chills, vomiting and diarrhea.  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 She drinks sweet tea with fast food exclusively. She denies drinking alcohol, energy drinks and coffee.  UA on 11/22/2018 revealed moderate bacteria; it was sent for culture which was negative.  On 12/27/2018, her urine looks negative.  On microscopic examination, some calcium oxalate crystals, mucus threads, and moderate bacteria were found.  She does not know if she is necessarily leaking urine due to the fistulas, and reports that the burning afterwards has faded but the pain is less frequent though not better.  Patient reports no blood since last visit.  She reports sometimes experiencing incontinence, mostly urge, though the leakage is  slight.  She has been working on increasing her liquids.  Discussed avoiding foods that irritate the bladder, bladder training, and possibly pharmaceutical treatment.  She says symptoms have improved some since her last visit and at this time is not interested in exploring the pharmaceutical treatment options.  She is interested in at least consulting with physical therapy to see if it might help.  Due to Crohn's, cannot try increasing fiber.  Will pursue conservative management options.  PMH: Past Medical History:  Diagnosis Date  . 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 Adiposity 11/03/2012  . Crohn's disease of rectum ([place]) 08/26/2015  . 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 COLONOSCOPY WITH PROPOFOL N/A 07/07/2017   Procedure: COLONOSCOPY WITH PROPOFOL;  Surgeon: Manya Silvas, MD;  Location: [facility] ENDOSCOPY;  Service: Endoscopy;  Laterality: N/A;  . fistula drain    . FISTULA PLUG     Home Medications:  Allergies as of 12/27/2018   No Known Allergies     Medication List       Accurate as of December 27, 2018  4:34 PM. Always use your most recent med list.        azaTHIOprine 50 MG tablet Commonly known as:  IMURAN Take by mouth.   metroNIDAZOLE 250 MG tablet Commonly known as:  FLAGYL Take by mouth.   sulfamethoxazole-trimethoprim 800-160 MG tablet Commonly known as:  BACTRIM DS,SEPTRA DS Take 1 tablet by mouth every 12 (twelve) hours.      Allergies: No Known Allergies  Family History: Family History  Problem  Relation Age of Onset  . Prostate cancer Neg Hx   . Kidney cancer Neg Hx   . Bladder Cancer Neg Hx    Social History:  reports that she quit smoking about 9 years ago. She has never used smokeless tobacco. She reports that she does not drink alcohol or use drugs.  ROS: UROLOGY Frequent Urination?: No Hard to postpone urination?: No Burning/pain with urination?: Yes Get up at night to  urinate?: Yes Leakage of urine?: Yes Urine stream starts and stops?: No Trouble starting stream?: No Do you have to strain to urinate?: No Blood in urine?: No Urinary tract infection?: No Sexually transmitted disease?: No Injury to kidneys or bladder?: No Painful intercourse?: No Weak stream?: No Currently pregnant?: No Vaginal bleeding?: No Last menstrual period?: n  Gastrointestinal Nausea?: No Vomiting?: No Indigestion/heartburn?: No Diarrhea?: No Constipation?: No  Constitutional Fever: No Night sweats?: No Weight loss?: No Fatigue?: No  Skin Skin rash/lesions?: No Itching?: No  Eyes Blurred vision?: No Double vision?: No  Ears/Nose/Throat Sore throat?: No Sinus problems?: No  Hematologic/Lymphatic Swollen glands?: No Easy bruising?: No  Cardiovascular Leg swelling?: No Chest pain?: No  Respiratory Cough?: No Shortness of breath?: No  Endocrine Excessive thirst?: No  Musculoskeletal Back pain?: No Joint pain?: No  Neurological Headaches?: No Dizziness?: No  Psychologic Depression?: No Anxiety?: No  Physical Exam: BP 128/83   Pulse 77   Ht 5' 7"  (1.702 m)   Wt (!) 300 lb 8 oz (136.3 kg)   SpO2 96%   BMI 47.06 kg/m   Constitutional:  Well nourished. Alert and oriented, No acute distress. HEENT: [place] AT, moist mucus membranes.  Trachea midline, no masses. Cardiovascular: No clubbing, cyanosis, or edema. Respiratory: Normal respiratory effort, no increased work of breathing. Skin: No rashes, bruises or suspicious lesions. Neurologic: Grossly intact, no focal deficits, moving all 4 extremities. Psychiatric: Normal mood and affect.  Assessment & Plan:    1. 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 Followed by Dr. Domenica Fail - UA today (12/27/2017) revealed moderate bacteria. Sent for culture. - RTC in 6 weeks for recheck  2. History of hematuria - Hematuria workup in 2016 - Dr. Junious Silk examined bladder via cysto with  minimal findings. RUS was unremarkable. - UA is negative for infection  3. Urge Incontinence - Offered behavioral therapies - Bladder training - Bladder control strategies - Discussed pharmaceutical therapies, patient at this time declines - Discussed physical therapy, patient has been provided with a referral.  Return in about 6 weeks (around 02/07/2019) for follow up PT .  Laneta Simmers   [facility] [address] [place], [place] [address] [phone]   I, Adele Schilder, am acting as a Education administrator for [employer], PA-C.   I have reviewed the above documentation for accuracy and completeness, and I agree with the above.    Zara Council, PA-C

## 2018-12-27 ENCOUNTER — Other Ambulatory Visit: Payer: Self-pay

## 2018-12-27 ENCOUNTER — Encounter: Payer: Self-pay | Admitting: Urology

## 2018-12-27 ENCOUNTER — Ambulatory Visit (INDEPENDENT_AMBULATORY_CARE_PROVIDER_SITE_OTHER): Payer: BC Managed Care – PPO | Admitting: Urology

## 2018-12-27 VITALS — BP 128/83 | HR 77 | Ht 67.0 in | Wt 300.5 lb

## 2018-12-27 DIAGNOSIS — N3941 Urge incontinence: Secondary | ICD-10-CM | POA: Diagnosis not present

## 2018-12-27 DIAGNOSIS — R310 Gross hematuria: Secondary | ICD-10-CM

## 2018-12-27 LAB — URINALYSIS, COMPLETE
Bilirubin, UA: NEGATIVE
Glucose, UA: NEGATIVE
Ketones, UA: NEGATIVE
Leukocytes, UA: NEGATIVE
NITRITE UA: NEGATIVE
PH UA: 5.5 (ref 5.0–7.5)
Protein, UA: NEGATIVE
RBC, UA: NEGATIVE
Specific Gravity, UA: >1.03 — ABNORMAL HIGH (ref 1.005–1.030)
UUROB: 0.2 mg/dL (ref 0.2–1.0)

## 2018-12-27 LAB — MICROSCOPIC EXAMINATION: RBC MICROSCOPIC, UA: NONE SEEN /HPF (ref 0–2)

## 2018-12-27 NOTE — Patient Instructions (Signed)
It is important that you drink 6 to 8 glasses of water daily.  Half of the fluid intake should be water. Take most of your fluids before dinner. Don't drink too much liquid at once, sip instead of gulp.  There are certain beverages and foods that have known to the bladder irritants and they should be avoided or consumed infrequently. These include but not limited to beverages and foods containing caffeine, carbonated beverages, highly acidic or spicy foods, such as fruits, tomato products, artificial sweeteners and alcohol.  If you find it difficult to avoid these foods it may be helpful to alternate water in between consuming these foods.  It is important to stay well hydrated and eat 24 to 30 grams of fiber daily.    It has shown that even weight loss can help with urinary incontinence. In this study it demonstrated that and a percent weight loss and obese women (20 pound loss for a 250 pound woman) cut the number of incontinence episodes nearly in half.

## 2018-12-30 LAB — CULTURE, URINE COMPREHENSIVE

## 2019-01-02 ENCOUNTER — Telehealth: Payer: Self-pay

## 2019-01-02 NOTE — Telephone Encounter (Signed)
Left message informing patient of negative urine culture results.

## 2019-01-02 NOTE — Telephone Encounter (Signed)
-----   Message from Nori Riis, PA-C sent at 01/02/2019  8:58 AM EST ----- Please let Mrs. Parchment know that her urine culture was negative.

## 2019-02-07 ENCOUNTER — Encounter: Payer: Self-pay | Admitting: Urology

## 2019-02-07 ENCOUNTER — Ambulatory Visit (INDEPENDENT_AMBULATORY_CARE_PROVIDER_SITE_OTHER): Payer: BC Managed Care – PPO | Admitting: Urology

## 2019-02-07 VITALS — BP 117/58 | HR 90 | Ht 67.0 in | Wt 304.0 lb

## 2019-02-07 DIAGNOSIS — N3941 Urge incontinence: Secondary | ICD-10-CM | POA: Diagnosis not present

## 2019-02-07 DIAGNOSIS — R300 Dysuria: Secondary | ICD-10-CM | POA: Diagnosis not present

## 2019-02-07 DIAGNOSIS — R3129 Other microscopic hematuria: Secondary | ICD-10-CM | POA: Diagnosis not present

## 2019-02-07 LAB — MICROSCOPIC EXAMINATION: RBC, UA: NONE SEEN /hpf (ref 0–2)

## 2019-02-07 LAB — URINALYSIS, COMPLETE
Bilirubin, UA: NEGATIVE
Glucose, UA: NEGATIVE
Ketones, UA: NEGATIVE
LEUKOCYTES UA: NEGATIVE
Nitrite, UA: NEGATIVE
PH UA: 6.5 (ref 5.0–7.5)
PROTEIN UA: NEGATIVE
RBC, UA: NEGATIVE
Specific Gravity, UA: >1.03 — ABNORMAL HIGH (ref 1.005–1.030)
Urobilinogen, Ur: 0.2 mg/dL (ref 0.2–1.0)

## 2019-02-07 LAB — BLADDER SCAN AMB NON-IMAGING

## 2019-02-07 NOTE — Progress Notes (Signed)
02/07/2019 4:07 PM   Diane Burgess 05-02-1986 [id number]  Referring provider: Katheren Shams [address] [place], [place] [address]  Chief Complaint  Patient presents with  . History of hematuria    HPI: Diane Burgess is a 33 y.o. female Caucasian with a history of Crohn's disease that presents today for a recheck on her UTI symptoms.  Background History On her 11/22/2018 visit, she admitted she hasn't seen a urologist since 01/2017. 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  She complained of frequency, urgency with minimal void, cramping dysuria, urine leakage, and hematuria that began last week. She reported symptoms started with frequency and developed into dysuria by 11/16/2018. She reported that her symptoms are greater in the afternoons and evenings than in the mornings. She noted blood when she wipes (from pink to red) but she was unsure if it is from her fistula or urine. She denied fever, chills, vomiting and diarrhea.  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 Malachi Bonds and [facility] (3-4x weekly). She drinks sweet tea with fast food exclusively. She denies drinking alcohol, energy drinks and coffee.  UA on 11/22/2018 revealed moderate bacteria; it was sent for culture which was negative.  On 12/27/2018, her urine looks negative.  On microscopic examination, some calcium oxalate crystals, mucus threads, and moderate bacteria were found.  She did not know if she was necessarily leaking urine due to the fistulas, and reported that the burning afterwards has faded but the pain was less frequent though not improved.  Patient reported no blood since last visit.  She reported sometimes experiencing incontinence, mostly urge, though the leakage was  slight.  She had been working on increasing her liquids.  Discussed avoiding foods that irritate the bladder, bladder training, and possibly pharmaceutical treatment.  She said symptoms have improved some since her previous visit and at that time was not interested in exploring the pharmaceutical treatment options.  She was interested in at least consulting with physical therapy to see if it might help.  Due to Crohn's, she cannot try increasing fiber and will pursue conservative management options.  Today (02/07/2019), the patient is  experiencing urgency x 0-3, frequency x 4-7, not restricting fluids to avoid visits to the restroom 0-3, is engaging in toilet mapping, incontinence x 0-3 and nocturia x 0-3.   Her BP is 117/58.   Her PVR is 10 mL.  She says things are okay today, though she still gets a little pain when she has held it for a little longer than she believes she ought to have.  She denies any current urinary symptoms.  She will be on antibiotics for the fistula until the end of March, and will be evaluated in 03/2019 to determine next steps.  She does say she has not been contacted by physical therapy, though it might have been a call she had drop immediately after answering, and currently feels she can wait to attempt physical therapy.  PMH: Past Medical History:  Diagnosis Date  . 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 Adiposity 11/03/2012  . Crohn's disease of rectum ([place]) 08/26/2015  . 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 COLONOSCOPY WITH PROPOFOL N/A 07/07/2017  Procedure: COLONOSCOPY WITH PROPOFOL;  Surgeon: Manya Silvas, MD;  Location: [facility] ENDOSCOPY;  Service: Endoscopy;  Laterality: N/A;  . fistula drain    . FISTULA PLUG     Home Medications:  Allergies as of 02/07/2019   No Known Allergies     Medication List       Accurate as of February 07, 2019  4:07 PM. Always use your most recent med list.          azaTHIOprine 50 MG tablet Commonly known as:  IMURAN Take by mouth.   metroNIDAZOLE 250 MG tablet Commonly known as:  FLAGYL   STELARA 90 MG/ML Sosy injection Generic drug:  ustekinumab   sulfamethoxazole-trimethoprim 800-160 MG tablet Commonly known as:  BACTRIM DS,SEPTRA DS Take 1 tablet by mouth every 12 (twelve) hours.      Allergies: No Known Allergies  Family History: Family History  Problem Relation Age of Onset  . Prostate cancer Neg Hx   . Kidney cancer Neg Hx   . Bladder Cancer Neg Hx    Social History:  reports that she quit smoking about 9 years ago. She has never used smokeless tobacco. She reports that she does not drink alcohol or use drugs.  ROS: UROLOGY Frequent Urination?: No Hard to postpone urination?: Yes Burning/pain with urination?: No Get up at night to urinate?: No Leakage of urine?: No Urine stream starts and stops?: No Trouble starting stream?: No Do you have to strain to urinate?: No Blood in urine?: No Urinary tract infection?: No Sexually transmitted disease?: No Injury to kidneys or bladder?: No Painful intercourse?: No Weak stream?: No Currently pregnant?: No Vaginal bleeding?: No Last menstrual period?: n  Gastrointestinal Nausea?: No Vomiting?: No Indigestion/heartburn?: No Diarrhea?: No Constipation?: No  Constitutional Fever: No Night sweats?: No Weight loss?: No Fatigue?: No  Skin Skin rash/lesions?: No Itching?: No  Eyes Blurred vision?: No Double vision?: No  Ears/Nose/Throat Sore throat?: No Sinus problems?: No  Hematologic/Lymphatic Swollen glands?: No Easy bruising?: No  Cardiovascular Leg swelling?: No Chest pain?: No  Respiratory Cough?: No Shortness of breath?: No  Endocrine Excessive thirst?: No  Musculoskeletal Back pain?: No Joint pain?: No  Neurological Headaches?: No Dizziness?: No  Psychologic Depression?: No Anxiety?: No  Physical Exam: BP (!) 117/58 (BP  Location: Left Arm, Patient Position: Sitting)   Pulse 90   Ht 5' 7"  (1.702 m)   Wt (!) 304 lb (137.9 kg)   BMI 47.61 kg/m   Constitutional: Well nourished. Alert and oriented, No acute distress. Cardiovascular: No clubbing, cyanosis, or edema. Respiratory: Normal respiratory effort, no increased work of breathing. Skin: No rashes, bruises or suspicious lesions. Neurologic: Grossly intact, no focal deficits, moving all 4 extremities. Psychiatric: Normal mood and affect.   Assessment & Plan:    1. 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 Followed by Dr. Domenica Fail - UA on 12/27/2017 revealed moderate bacteria. Sent for culture. - RTC as needed  2. History of hematuria - Hematuria workup in 2016 - Dr. Junious Silk examined bladder via cysto with minimal findings. RUS was unremarkable. - UA on 12/27/2017 negative for infection  3. Urge Incontinence - Resolved   Return if symptoms worsen or fail to improve.  Laneta Simmers   [facility] [address] [place], [place] [address] [phone]   I, Adele Schilder, am acting as a Education administrator for [employer], PA-C.   I have reviewed the above documentation for accuracy and completeness, and  I agree with the above.    Zara Council, PA-C

## 2019-12-05 ENCOUNTER — Ambulatory Visit
Admission: EM | Admit: 2019-12-05 | Discharge: 2019-12-05 | Disposition: A | Discharge status: Home / Self Care | Payer: BC Managed Care – PPO | Attending: Urgent Care | Admitting: Urgent Care

## 2019-12-05 ENCOUNTER — Other Ambulatory Visit: Payer: Self-pay

## 2019-12-05 ENCOUNTER — Encounter: Payer: Self-pay | Admitting: Emergency Medicine

## 2019-12-05 DIAGNOSIS — Z7189 Other specified counseling: Secondary | ICD-10-CM

## 2019-12-05 DIAGNOSIS — Z20828 Contact with and (suspected) exposure to other viral communicable diseases: Secondary | ICD-10-CM

## 2019-12-05 DIAGNOSIS — J029 Acute pharyngitis, unspecified: Secondary | ICD-10-CM

## 2019-12-05 DIAGNOSIS — Z20822 Contact with and (suspected) exposure to covid-19: Secondary | ICD-10-CM

## 2019-12-05 LAB — GROUP A STREP BY PCR: Group A Strep by PCR: NOT DETECTED

## 2019-12-05 NOTE — ED Triage Notes (Signed)
Patient in today c/o sore throat since ~3:30am this morning. Patient also had a +covid exposure on Sunday (12/03/19).

## 2019-12-05 NOTE — ED Provider Notes (Signed)
[place], [place]   Name: Diane Burgess DOB: 1986-08-16 MRN: [id number] CSN: [id number] PCP: [facility]  Arrival date and time:  12/05/19 1648  Chief Complaint:  Sore Throat and COVID exposure   NOTE: Prior to seeing the patient today, I have reviewed the triage nursing documentation and vital signs. Clinical staff has updated patient's PMH/PSHx, current medication list, and drug allergies/intolerances to ensure comprehensive history available to assist in medical decision making.   History:   HPI: Diane Burgess is a 33 y.o. female who presents today with complaints of sore throat that started at approximately 0330 today. Patient denies fevers. She denies any cough, shortness of breath, or wheezing. She denies that she has experienced any nausea, vomiting, diarrhea, or abdominal pain. She is eating and drinking well. Patient denies any perceived alterations to her sense of taste or smell. Patient presents out of concern for her own personal health after being exposed to her brother-in-law over the weekend; he tested positive for SARS-CoV-2 (novel coronavirus) today. She has not been tested for SARS-CoV-2 (novel coronavirus) in the past 14 days; last tested negative on 08/17/2019 per her report. Patient has not been vaccinated for influenza this season. Despite her symptoms, patient has not taken any over the counter interventions to help improve/relieve her reported symptoms at home. Patient unsure if symptoms related to SARS-CoV-2 exposure or if her symptoms are coming from her exposure to a Christmas tree yesterday. Patient has known seasonal/environmental allergies, with Christmas trees being a known trigger in the past.   Past Medical History:  Diagnosis Date  . 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 Adiposity 11/03/2012  . Crohn's disease of rectum ([place]) 08/26/2015  . 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 COLONOSCOPY WITH  PROPOFOL N/A 07/07/2017   Procedure: COLONOSCOPY WITH PROPOFOL;  Surgeon: Manya Silvas, MD;  Location: [facility] ENDOSCOPY;  Service: Endoscopy;  Laterality: N/A;  . fistula drain    . FISTULA PLUG      Family History  Problem Relation Age of Onset  . Hypertension Mother   . Hypertension Father   . Prostate cancer Neg Hx   . Kidney cancer Neg Hx   . Bladder Cancer Neg Hx     Social History   Tobacco Use  . Smoking status: Former Smoker    Quit date: 08/25/2009    Years since quitting: 10.2  . Smokeless tobacco: Never Used  Substance Use Topics  . Alcohol use: No    Alcohol/week: 0.0 standard drinks  . Drug use: No    Patient Active Problem List   Diagnosis Date Noted  . Acute pain of right knee 12/16/2018  . Primary osteoarthritis of right knee 12/16/2018  . Fissure, anal 10/20/2018  . B12 deficiency 06/13/2017  . Crohn's disease of rectum ([place]) 08/26/2015  . 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 Adiposity 11/03/2012  . 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 ciprofloxacin (CIPRO) tablet 500 mg  . lidocaine (XYLOCAINE) 2 % jelly 1 application   Current Meds  Medication Sig  . levocetirizine (XYZAL) 5 MG tablet Take 5 mg by mouth every evening.  Delsa Grana 90 MG/ML SOSY injection     Allergies:   Patient has no known allergies.  Review of Systems (ROS): Review of Systems  Constitutional: Negative for fatigue and fever.  HENT: Positive for sore  throat. Negative for congestion, ear pain, postnasal drip, rhinorrhea, sinus pressure, sinus pain and sneezing.   Eyes: Negative for pain, discharge and redness.  Respiratory: Negative for cough, chest tightness and shortness of breath.   Cardiovascular: Negative for chest pain and palpitations.  Gastrointestinal: Negative for abdominal pain, diarrhea, nausea and vomiting.  Musculoskeletal: Negative for  arthralgias, back pain, myalgias and neck pain.  Skin: Negative for color change, pallor and rash.  Allergic/Immunologic: Positive for environmental allergies (seasonal).  Neurological: Negative for dizziness, syncope, weakness and headaches.  Hematological: Negative for adenopathy.     Vital Signs: Today's Vitals   12/05/19 1712 12/05/19 1713 12/05/19 1805  BP:  127/86   Pulse:  79   Resp:  18   Temp:  98.2 F (36.8 C)   TempSrc:  Oral   SpO2:  100%   Weight:  (!) 313 lb (142 kg)   Height:  5' 7"  (1.702 m)   PainSc: 4   4     Physical Exam: Physical Exam  Constitutional: She is oriented to person, place, and time and well-developed, well-nourished, and in no distress.  HENT:  Head: Normocephalic and atraumatic.  Nose: Nose normal.  Mouth/Throat: Uvula is midline. Posterior oropharyngeal erythema (mild with (+) clear PND) present. No oropharyngeal exudate or posterior oropharyngeal edema.  Eyes: Pupils are equal, round, and reactive to light.  Cardiovascular: Normal rate, regular rhythm, normal heart sounds and intact distal pulses.  Pulmonary/Chest: Effort normal and breath sounds normal.  Neurological: She is alert and oriented to person, place, and time. Gait normal.  Skin: Skin is warm and dry. No rash noted. She is not diaphoretic.  Psychiatric: Mood, memory, affect and judgment normal.  Nursing note and vitals reviewed.   Urgent Care Treatments / Results:   Orders Placed This Encounter  Procedures  . Novel Coronavirus, NAA (Hosp order, Send-out to Ref Lab; TAT 18-24 hrs  . Group A Strep by PCR    LABS: PLEASE NOTE: all labs that were ordered this encounter are listed, however only abnormal results are displayed. Labs Reviewed  GROUP A STREP BY PCR  NOVEL CORONAVIRUS, NAA (HOSP ORDER, SEND-OUT TO REF LAB; TAT 18-24 HRS)    EKG: -None  RADIOLOGY: No results found.  PROCEDURES: Procedures  MEDICATIONS RECEIVED THIS VISIT: Medications - No data to  display  PERTINENT CLINICAL COURSE NOTES/UPDATES:   Initial Impression / Assessment and Plan / Urgent Care Course:  Pertinent labs & imaging results that were available during my care of the patient were personally reviewed by me and considered in my medical decision making (see lab/imaging section of note for values and interpretations).  Diane Burgess is a 33 y.o. female who presents to [facility] today with complaints of Sore Throat and COVID exposure   Patient overall well appearing and in no acute distress today in clinic. Presenting symptoms (see HPI) and exam as documented above. She presents with symptoms associated with SARS-CoV-2 (novel coronavirus). Discussed typical symptom constellation. Reviewed potential for infection and need for testing. Patient amenable to being tested. SARS-CoV-2 swab collected by certified clinical staff. Discussed variable turn around times associated with testing, as swabs are being processed at [facility], and have been taking between 2-5 days to come back. She was advised to self quarantine, per [facility] DHHS guidelines, until negative results received. These measures are being implemented out of an abundance of caution to prevent transmission and spread during the current SARS-CoV-2 pandemic.  Rapid streptococcal throat swab (-);  reflex culture sent. Suspect likely related to her allergies, however a viral etiology is also a potential at this time. She was encouraged to take the levocetirizine that she has on hand. Until ruled out with confirmatory lab testing, SARS-CoV-2 remains part of the differential. Her testing is pending at this time. I discussed with her that her symptoms are felt to be viral in nature, thus antibiotics would not offer her any relief or improve his symptoms any faster than conservative symptomatic management. Discussed supportive care measures at home during acute phase of illness. Patient to rest as much as possible. She was encouraged  to ensure adequate hydration (water and ORS) to prevent dehydration and electrolyte derangements. Patient may use APAP and/or IBU on an as needed basis for pain/fever. Recommended warm salt water gargles, hard candies/lozenges, and hot tea with honey/lemon to help soothe the throat and reduce irritation. May use Tylenol and/or Ibuprofen as needed for pain/fever.   Discussed follow up with primary care physician in 1 week for re-evaluation. I have reviewed the follow up and strict return precautions for any new or worsening symptoms. Patient is aware of symptoms that would be deemed urgent/emergent, and would thus require further evaluation either here or in the emergency department. At the time of discharge, she verbalized understanding and consent with the discharge plan as it was reviewed with her. All questions were fielded by provider and/or clinic staff prior to patient discharge.    Final Clinical Impressions / Urgent Care Diagnoses:   Final diagnoses:  Pharyngitis, unspecified etiology  Exposure to COVID-19 virus  Encounter for laboratory testing for COVID-19 virus  Advice given about COVID-19 virus infection    New Prescriptions:  [place] Controlled Substance Registry consulted? Not Applicable  No orders of the defined types were placed in this encounter.   Recommended Follow up Care:  Patient encouraged to follow up with the following provider within the specified time frame, or sooner as dictated by the severity of her symptoms. As always, she was instructed that for any urgent/emergent care needs, she should seek care either here or in the emergency department for more immediate evaluation.  Follow-up Information    Medicine, [facility] In 1 week.   Why: General reassessment of symptoms if not improving Contact information: [place] [phone]         NOTE: This note was prepared using Dragon dictation software along with smaller  phrase technology. Despite my best ability to proofread, there is the potential that transcriptional errors may still occur from this process, and are completely unintentional.    Karen Kitchens, NP 12/05/19 [phone]

## 2019-12-05 NOTE — Discharge Instructions (Signed)
It was very nice seeing you today in clinic. Thank you for entrusting me with your care.   With you only symptom being sore throat, it could be viral or related to your allergies. Make sure you are taking your allergy medication. Recommend warm salt water gargles, hard candies/lozenges, and hot tea with honey/lemon to help soothe the throat and reduce irritation. May use Tylenol and/or Ibuprofen as needed for discomfort.   You were tested for SARS-CoV-2 (novel coronavirus) today. Testing is performed by an outside lab ([facility]) and has variable turn around times ranging between 2-5 days. Current recommendations from the the CDC and [place] DHHS require that you remain out of work in order to quarantine at home until negative test results are have been received. In the event that your test results are positive, you will be contacted with further directives. These measures are being implemented out of an abundance of caution to prevent transmission and spread during the current SARS-CoV-2 pandemic.   If you develop any worsening symptoms or concerns, make arrangements to follow up with your regular doctor. If your symptoms are severe, please seek follow up care in the ER. Please remember, our [place] providers are "right here with you" when you need [place].   Again, it was my pleasure to take care of you today. Thank you for choosing our clinic. I hope that you start to feel better quickly.   Honor Loh, MSN, APRN, FNP-C, CEN Advanced Practice Provider [facility]

## 2019-12-06 LAB — NOVEL CORONAVIRUS, NAA (HOSP ORDER, SEND-OUT TO REF LAB; TAT 18-24 HRS): SARS-CoV-2, NAA: NOT DETECTED

## 2020-02-08 ENCOUNTER — Telehealth: Payer: Self-pay | Admitting: *Deleted

## 2020-02-08 NOTE — Telephone Encounter (Signed)
Calling to cancel her Covid 19 vaccine appoointment that was scheduled by the [facility]. Suggested she call [facility] as there is no appointment on Epic.

## 2022-03-13 ENCOUNTER — Other Ambulatory Visit: Payer: Self-pay | Admitting: Pulmonary Disease

## 2022-03-13 DIAGNOSIS — J849 Interstitial pulmonary disease, unspecified: Secondary | ICD-10-CM

## 2022-03-27 ENCOUNTER — Ambulatory Visit: Payer: BC Managed Care – PPO

## 2022-04-06 ENCOUNTER — Other Ambulatory Visit: Payer: Self-pay | Admitting: Pulmonary Disease

## 2022-04-06 ENCOUNTER — Ambulatory Visit
Admission: RE | Admit: 2022-04-06 | Discharge: 2022-04-06 | Disposition: A | Discharge status: Home / Self Care | Payer: BC Managed Care – PPO | Source: Ambulatory Visit | Attending: Pulmonary Disease | Admitting: Pulmonary Disease

## 2022-04-06 DIAGNOSIS — J849 Interstitial pulmonary disease, unspecified: Secondary | ICD-10-CM | POA: Diagnosis not present

## 2023-02-18 ENCOUNTER — Other Ambulatory Visit: Payer: Self-pay | Admitting: Student

## 2023-02-18 DIAGNOSIS — H918X3 Other specified hearing loss, bilateral: Secondary | ICD-10-CM

## 2023-02-18 DIAGNOSIS — H9191 Unspecified hearing loss, right ear: Secondary | ICD-10-CM

## 2023-02-28 ENCOUNTER — Ambulatory Visit
Admission: RE | Admit: 2023-02-28 | Discharge: 2023-02-28 | Disposition: A | Discharge status: Home / Self Care | Payer: BC Managed Care – PPO | Source: Ambulatory Visit | Attending: Student | Admitting: Student

## 2023-02-28 DIAGNOSIS — H918X3 Other specified hearing loss, bilateral: Secondary | ICD-10-CM | POA: Diagnosis present

## 2023-02-28 DIAGNOSIS — H9191 Unspecified hearing loss, right ear: Secondary | ICD-10-CM

## 2023-02-28 MED ORDER — GADOBUTROL 1 MMOL/ML IV SOLN
10.0000 mL | Freq: Once | INTRAVENOUS | Status: AC | PRN
  Administered 2023-02-28: 10 mL via INTRAVENOUS

## 2023-07-24 IMAGING — CT CT CHEST HIGH RESOLUTION
2 of 7 series · 15 of 36 positions shown, 18 images · non-contrast
Comparison: None.

CLINICAL DATA: Shortness of breath on exertion, former smoker



[Series 5: high resolution retro · axial · 0.71mm/px · z∈[-356,-89]mm · 12 of 317 slices shown, 15 images]
[im 25/317  mediastinal]
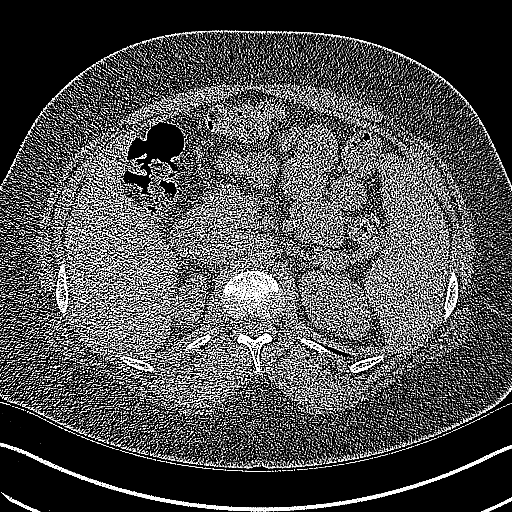
[im 25/317  lung]
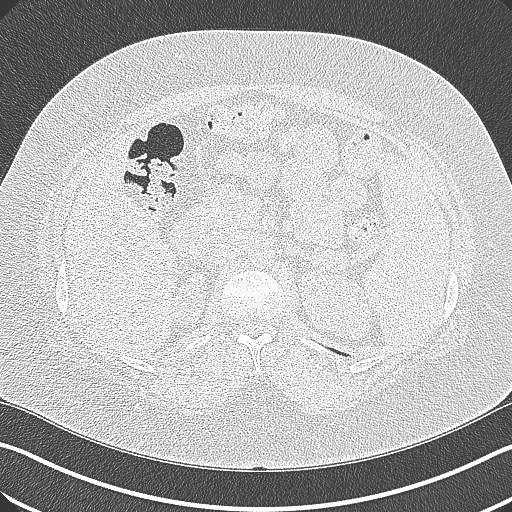
[im 49/317  lung]
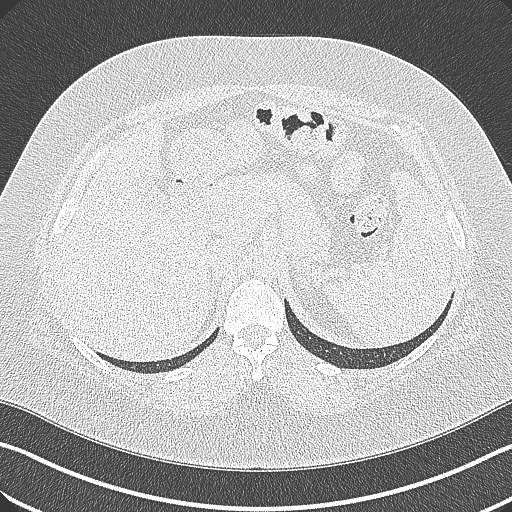
[im 73/317  lung]
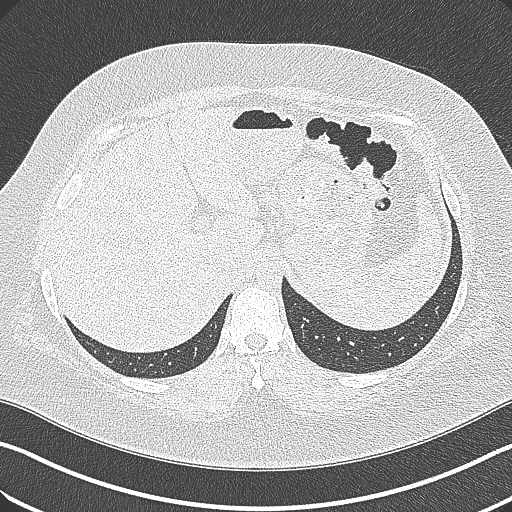
[im 98/317  lung]
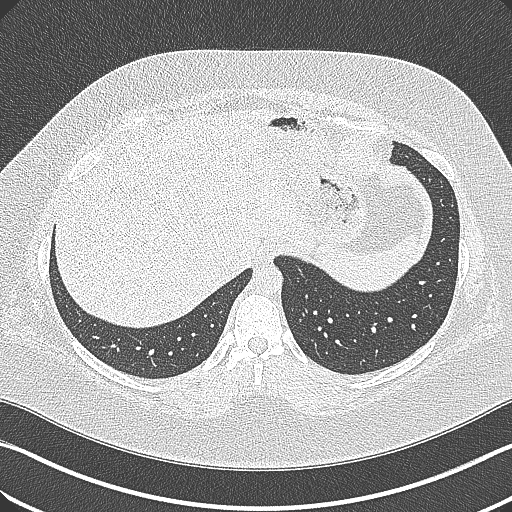
[im 122/317  mediastinal]
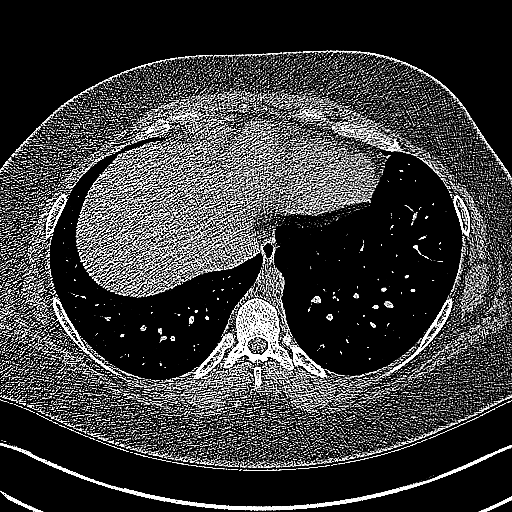
[im 122/317  lung]
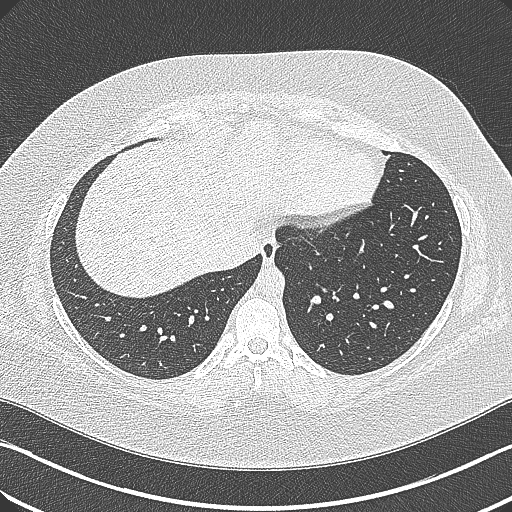
[im 146/317  lung]
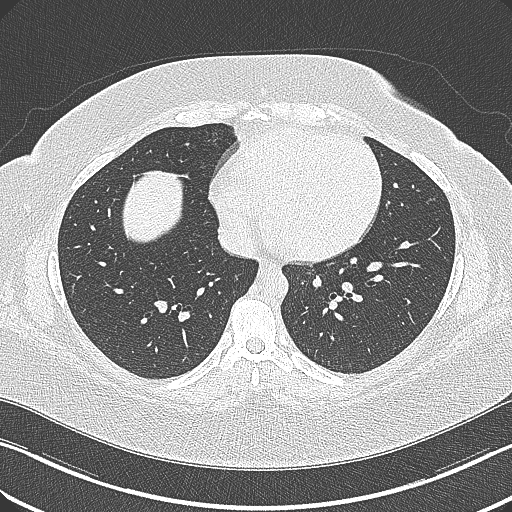
[im 171/317  lung]
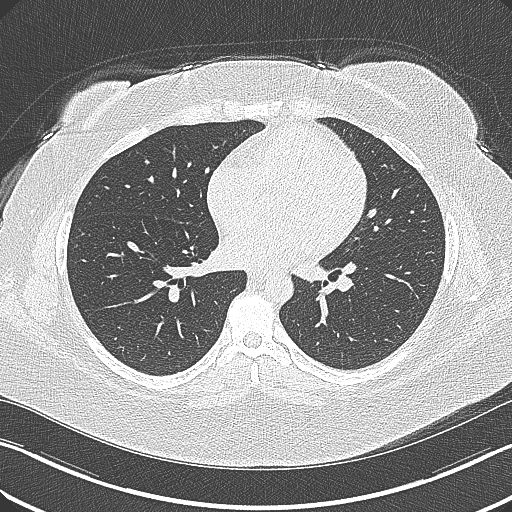
[im 195/317  lung]
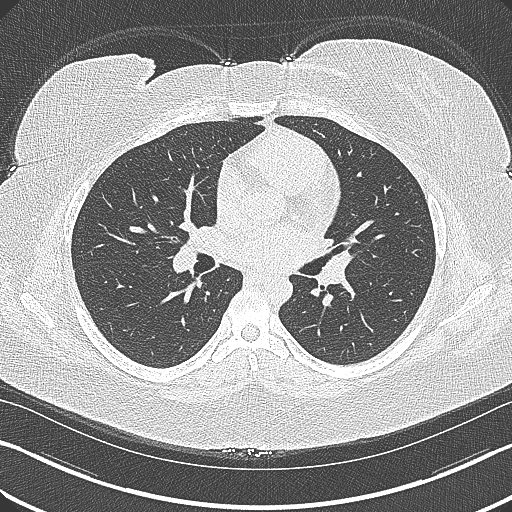
[im 219/317  mediastinal]
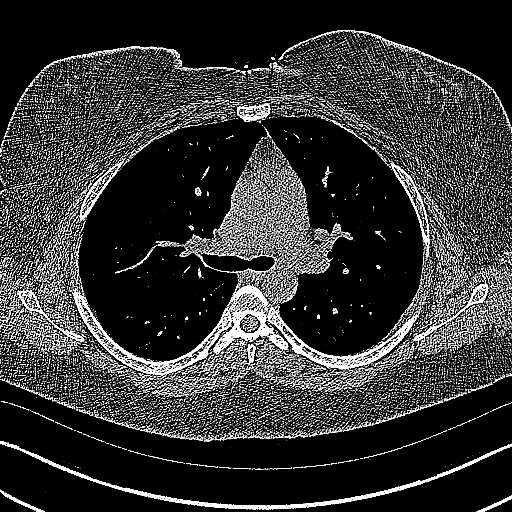
[im 219/317  lung]
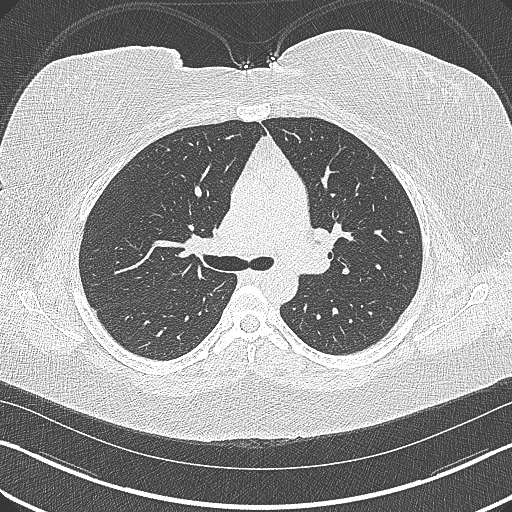
[im 244/317  lung]
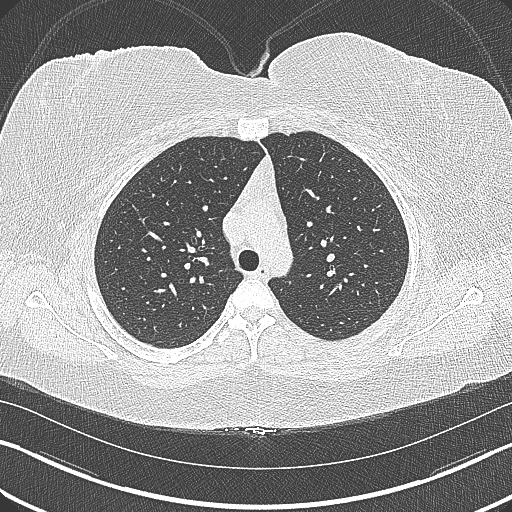
[im 268/317  lung]
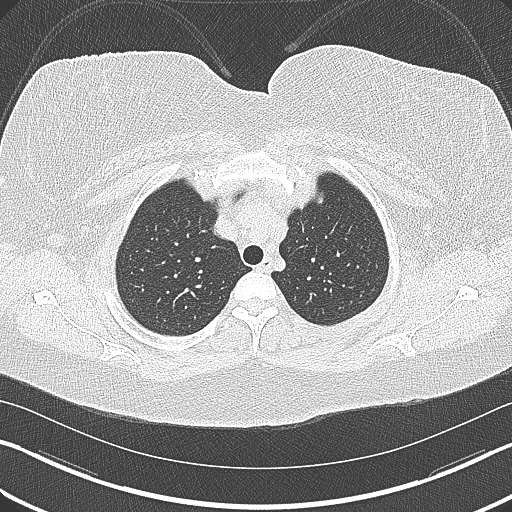
[im 292/317  lung]
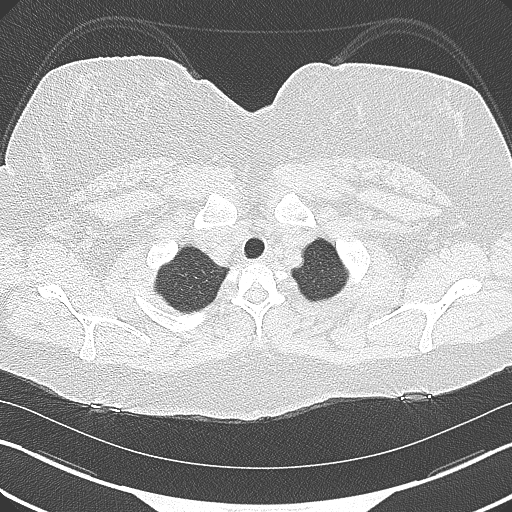

[Series 8: coronal · coronal · 0.69mm/px · 3 of 106 slices shown]
[im 22/106  lung]
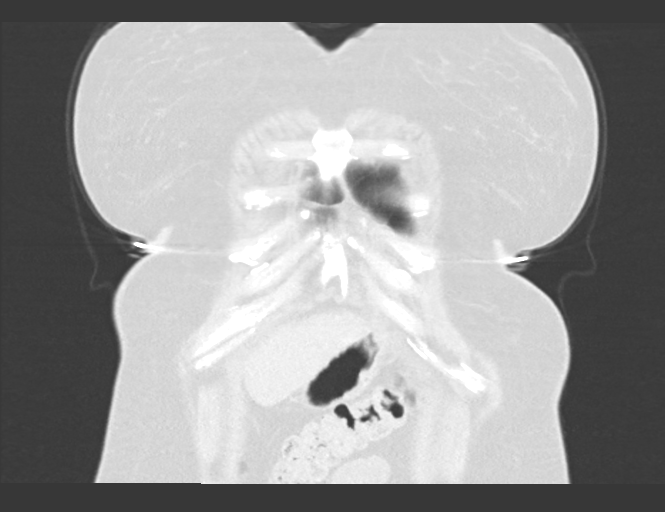
[im 43/106  lung]
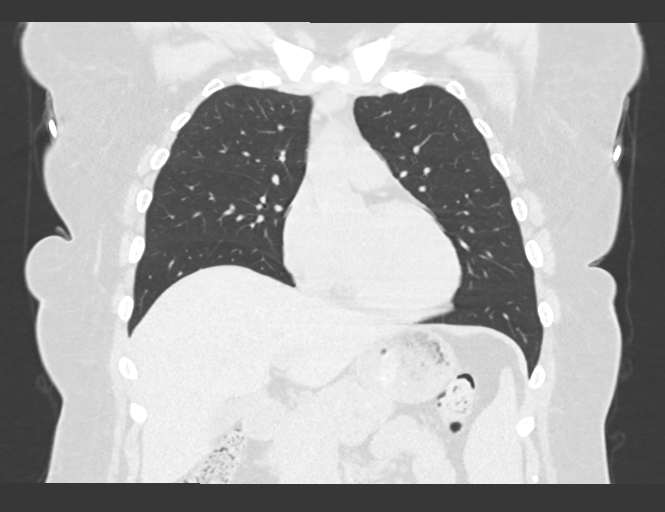
[im 64/106  lung]
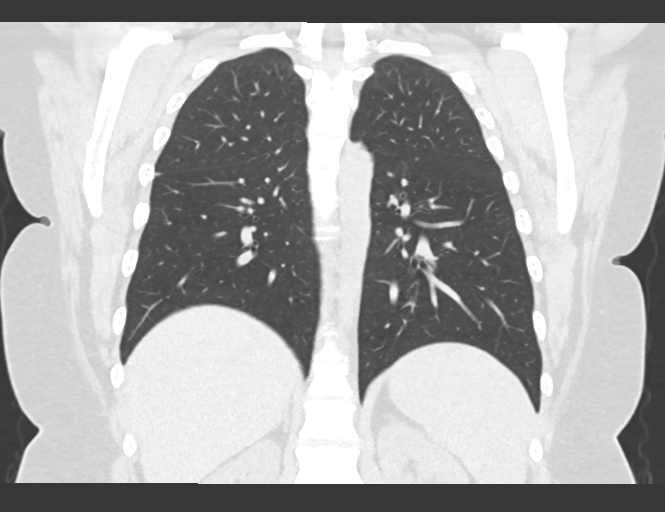

[15 of 36 positions shown; findings below may reference images not displayed]

FINDINGS: Cardiovascular: No significant vascular findings. Normal heart size.
No pericardial effusion.

Mediastinum/Nodes: No enlarged mediastinal, hilar, or axillary lymph
nodes. Thymic remnant in the anterior mediastinum. Thyroid gland,
trachea, and esophagus demonstrate no significant findings.

Lungs/Pleura: Lungs are clear. No evidence of fibrotic interstitial
lung disease. Mild, lobular air trapping on expiratory phase
imaging. No pleural effusion or pneumothorax.

Upper Abdomen: No acute abnormality.

Musculoskeletal: No chest wall abnormality. No suspicious osseous
lesions identified.
IMPRESSION: 1. No evidence of fibrotic interstitial lung disease.
2. Mild, lobular air trapping on expiratory phase imaging, in
keeping with small airways disease.

## 2024-12-18 NOTE — Progress Notes (Unsigned)
 "    12/19/2024 7:31 PM   Diane Burgess 01-30-1986 [id number]  Referring provider: [facility] [address] [place],  [place] [address]  Urological history: 1. rUTI's - hx of Crohn's - cysto (2016) NED   Chief Complaint  Patient presents with   Recurrent UTI   HPI: Diane Burgess is a 39 y.o. woman who presents today for recurrent UTI's.  Previous records reviewed.  2 weeks prior to Thanksgiving, she had the sudden onset of increase in urgency.  She sought care at her primary care physician's office and the UA had rare bacteria, so she was instructed to take AZO.  The AZO was not helpful, but the urine culture returned positive for E.coli and she was placed on antibiotic.  Her symptoms worsened and then she went to the urgent care where she was given another antibiotic, when the culture returned positive for streptococcus agalactiae and she was switched to Augmentin.  The Augmentin brought her relief.  She still continues to feel a loose crampy sensation in her lower abdomen and she has feelings of incomplete bladder emptying and urgency.  Patient denies any modifying or aggravating factors.  Patient denies any recent UTI's, gross hematuria, dysuria or suprapubic/flank pain.  Patient denies any fevers, chills, nausea or vomiting.    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 If you have any questions, ask your nurse or doctor.          STOP taking these medications    mirabegron ER 25 MG Tb24 tablet Commonly known as: MYRBETRIQ       TAKE these medications    drospirenone-ethinyl estradiol 3-0.02 MG tablet Commonly known as: YAZ Take 1 tablet by mouth daily.   ferrous sulfate 325 (65 FE) MG tablet Take 325 mg by mouth.   Gemtesa  75 MG Tabs Generic drug: Vibegron  Take 1 tablet (75 mg total) by mouth daily.   Gemtesa  75 MG Tabs Generic drug: Vibegron  Take 1 tablet (75 mg total) by mouth daily for 28 days.   levocetirizine 5 MG tablet Commonly known as: XYZAL Take 5 mg by mouth every evening.   spironolactone 50 MG tablet Commonly known as: ALDACTONE Take 50 mg by mouth daily.   Stelara 90 MG/ML Sosy injection Generic drug: ustekinumab        Allergies: Allergies[1]  Family History: Family History  Problem Relation Age of Onset   Hypertension Mother    Hypertension Father    Prostate cancer Neg Hx  Kidney cancer Neg Hx    Bladder Cancer Neg Hx     Social History:  reports that she quit smoking about 15 years ago. She has never used smokeless tobacco. She reports that she does not drink alcohol and does not use drugs.  ROS: Pertinent ROS in HPI  Physical Exam: BP 108/77   Pulse 83   Wt 280 lb (127 kg)   SpO2 100%   BMI 43.85 kg/m   Constitutional:  Well nourished. Alert and oriented, No acute distress. HEENT: [place] AT, moist mucus membranes.  Trachea midline Cardiovascular: No clubbing, cyanosis, or edema. Respiratory: Normal respiratory effort, no increased work of breathing. Neurologic: Grossly intact, no focal deficits, moving all 4 extremities. Psychiatric: Normal mood and  affect.    Laboratory Data: See Epic and HPI   I have reviewed the labs.   Pertinent Imaging:  12/19/24 16:23  Scan Result 25mL    Assessment & Plan:    1. rUTI's - criteria for recurrent UTI has been met with 2 or more infections in 6 months or 3 or greater infections in one year  - patient is instructed to increase their water intake until the urine is pale yellow or clear (10 to 12 cups daily); which is difficult for her to do because of her profession - will order a RUS to evaluate for hydro f  2. Urgency - UA was bland at today's visit - given Gemtesa  75 mg daily to help with urgency                              Return in about 4 weeks (around 01/16/2025) for RUS report and symptom recheck .  These notes generated with voice recognition software. I apologize for typographical errors.  CLOTILDA CORNWALL, PA-C  [facility] [address]  [address] [place], [place] [address] [phone]     [1] No Known Allergies  "

## 2024-12-19 ENCOUNTER — Ambulatory Visit: Payer: Self-pay | Admitting: Urology

## 2024-12-19 VITALS — BP 108/77 | HR 83 | Wt 280.0 lb

## 2024-12-19 DIAGNOSIS — Z87440 Personal history of urinary (tract) infections: Secondary | ICD-10-CM

## 2024-12-19 DIAGNOSIS — R3915 Urgency of urination: Secondary | ICD-10-CM | POA: Diagnosis not present

## 2024-12-19 DIAGNOSIS — N390 Urinary tract infection, site not specified: Secondary | ICD-10-CM

## 2024-12-19 DIAGNOSIS — R350 Frequency of micturition: Secondary | ICD-10-CM

## 2024-12-19 LAB — BLADDER SCAN AMB NON-IMAGING

## 2024-12-19 MED ORDER — GEMTESA 75 MG PO TABS: 75.0000 mg | ORAL_TABLET | Freq: Every day | ORAL | Status: AC

## 2024-12-19 NOTE — Patient Instructions (Addendum)
 Please Call Scheduling at [phone] to schedule the renal ultrasound

## 2024-12-20 LAB — URINALYSIS, COMPLETE
Bilirubin, UA: NEGATIVE
Glucose, UA: NEGATIVE
Ketones, UA: NEGATIVE
Leukocytes,UA: NEGATIVE
Nitrite, UA: NEGATIVE
Protein,UA: NEGATIVE
RBC, UA: NEGATIVE
Specific Gravity, UA: 1.03 (ref 1.005–1.030)
Urobilinogen, Ur: 0.2 mg/dL (ref 0.2–1.0)
pH, UA: 6 (ref 5.0–7.5)

## 2024-12-20 LAB — MICROSCOPIC EXAMINATION: Epithelial Cells (non renal): >10 /HPF — AB (ref 0–10)

## 2024-12-24 ENCOUNTER — Encounter: Payer: Self-pay | Admitting: Urology

## 2025-01-16 ENCOUNTER — Ambulatory Visit
Admission: RE | Admit: 2025-01-16 | Discharge: 2025-01-16 | Disposition: A | Source: Ambulatory Visit | Attending: Urology

## 2025-01-16 DIAGNOSIS — N390 Urinary tract infection, site not specified: Secondary | ICD-10-CM

## 2025-01-19 NOTE — Progress Notes (Unsigned)
 "    01/22/2025 11:51 AM   Diane Burgess [birth year] [id number]  Referring provider: [facility] [address] [place],  [place] [address]  Urological history: 1. rUTI's - hx of Crohn's - cysto (2016) NED   No chief complaint on file.  HPI: Diane Burgess is a 39 y.o. woman who presents today for recurrent UTI's.  Previous records reviewed.  2 weeks prior to Thanksgiving, she had the sudden onset of increase in urgency.  She sought care at her primary care physician's office and the UA had rare bacteria, so she was instructed to take AZO.  The AZO was not helpful, but the urine culture returned positive for E.coli and she was placed on antibiotic.  Her symptoms worsened and then she went to the urgent care where she was given another antibiotic, when the culture returned positive for streptococcus agalactiae and she was switched to Augmentin.  The Augmentin brought her relief.  She still continues to feel a loose crampy sensation in her lower abdomen and she has feelings of incomplete bladder emptying and urgency.  Patient denies any modifying or aggravating factors.  Patient denies any recent UTI's, gross hematuria, dysuria or suprapubic/flank pain.  Patient denies any fevers, chills, nausea or vomiting.    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 If you have any questions, ask your nurse or doctor.          drospirenone-ethinyl estradiol 3-0.02 MG tablet Commonly known as: YAZ Take 1 tablet by mouth daily.   ferrous sulfate 325 (65 FE) MG tablet Take 325 mg by mouth.   Gemtesa  75 MG Tabs Generic drug: Vibegron  Take 1 tablet (75 mg total) by mouth daily.   levocetirizine 5 MG tablet Commonly known as: XYZAL Take 5 mg by mouth every evening.   spironolactone 50 MG tablet Commonly known as: ALDACTONE Take 50 mg by mouth daily.   Stelara 90 MG/ML Sosy injection Generic drug: ustekinumab        Allergies: Allergies[1]  Family History: Family History  Problem Relation Age of Onset   Hypertension Mother    Hypertension Father    Prostate cancer Neg Hx    Kidney cancer Neg Hx    Bladder Cancer Neg Hx     Social History:  reports that she quit smoking about 15 years ago. Her smoking use included cigarettes. She has never used smokeless tobacco. She reports that she does not drink alcohol and does not use  drugs.  ROS: Pertinent ROS in HPI  Physical Exam: There were no vitals taken for this visit.  Constitutional:  Well nourished. Alert and oriented, No acute distress. HEENT: [place] AT, moist mucus membranes.  Trachea midline Cardiovascular: No clubbing, cyanosis, or edema. Respiratory: Normal respiratory effort, no increased work of breathing. Neurologic: Grossly intact, no focal deficits, moving all 4 extremities. Psychiatric: Normal mood and affect.    Laboratory Data: See Epic and HPI   I have reviewed the labs.   Pertinent Imaging:  12/19/24 16:23  Scan Result 25mL    Assessment & Plan:    1. rUTI's - criteria for recurrent UTI has been met with 2  or more infections in 6 months or 3 or greater infections in one year  - patient is instructed to increase their water intake until the urine is pale yellow or clear (10 to 12 cups daily); which is difficult for her to do because of her profession - will order a RUS to evaluate for hydro f  2. Urgency - UA was bland at today's visit - given Gemtesa  75 mg daily to help with urgency                              No follow-ups on file.  These notes generated with voice recognition software. I apologize for typographical errors.  Diane CORNWALL, PA-C  [facility] [address]  [address] [place], [place] [address] [phone]     [1] No Known Allergies  "

## 2025-01-22 ENCOUNTER — Ambulatory Visit: Admitting: Urology
